# Patient Record
Sex: Male | Born: 1955 | Race: White | Hispanic: No | Marital: Married | Smoking: Never smoker
Health system: Southern US, Community
[De-identification: ages and names within clinical notes are randomized; demographics above are authoritative.]

## PROBLEM LIST (undated history)

## (undated) DIAGNOSIS — M1990 Unspecified osteoarthritis, unspecified site: Secondary | ICD-10-CM

## (undated) DIAGNOSIS — T7840XA Allergy, unspecified, initial encounter: Secondary | ICD-10-CM

## (undated) DIAGNOSIS — I10 Essential (primary) hypertension: Secondary | ICD-10-CM

## (undated) DIAGNOSIS — E785 Hyperlipidemia, unspecified: Secondary | ICD-10-CM

## (undated) DIAGNOSIS — R739 Hyperglycemia, unspecified: Secondary | ICD-10-CM

## (undated) DIAGNOSIS — K219 Gastro-esophageal reflux disease without esophagitis: Secondary | ICD-10-CM

## (undated) HISTORY — PX: STRABISMUS SURGERY: SHX218

## (undated) HISTORY — DX: Gastro-esophageal reflux disease without esophagitis: K21.9

## (undated) HISTORY — DX: Unspecified osteoarthritis, unspecified site: M19.90

## (undated) HISTORY — DX: Hyperlipidemia, unspecified: E78.5

## (undated) HISTORY — DX: Allergy, unspecified, initial encounter: T78.40XA

## (undated) HISTORY — DX: Hyperglycemia, unspecified: R73.9

## (undated) HISTORY — PX: APPENDECTOMY: SHX54

## (undated) HISTORY — DX: Essential (primary) hypertension: I10

---

## 1999-02-23 ENCOUNTER — Encounter: Payer: Self-pay | Admitting: Internal Medicine

## 1999-02-23 ENCOUNTER — Encounter: Admission: RE | Admit: 1999-02-23 | Discharge: 1999-02-23 | Payer: Self-pay | Admitting: Internal Medicine

## 2004-04-16 ENCOUNTER — Ambulatory Visit: Payer: Self-pay | Admitting: Family Medicine

## 2004-04-20 ENCOUNTER — Ambulatory Visit: Payer: Self-pay | Admitting: Family Medicine

## 2004-07-02 ENCOUNTER — Ambulatory Visit: Payer: Self-pay | Admitting: Family Medicine

## 2004-07-04 ENCOUNTER — Emergency Department (HOSPITAL_COMMUNITY): Admission: EM | Admit: 2004-07-04 | Discharge: 2004-07-04 | Payer: Self-pay | Admitting: Family Medicine

## 2004-07-07 ENCOUNTER — Ambulatory Visit: Payer: Self-pay | Admitting: Family Medicine

## 2004-07-27 ENCOUNTER — Ambulatory Visit: Payer: Self-pay | Admitting: Family Medicine

## 2005-05-12 ENCOUNTER — Ambulatory Visit: Payer: Self-pay | Admitting: Family Medicine

## 2005-07-06 ENCOUNTER — Ambulatory Visit: Payer: Self-pay | Admitting: Family Medicine

## 2005-09-18 IMAGING — CT CT HEAD W/O CM
1 of 2 series · 13 of 30 positions shown, 17 images · non-contrast
Comparison: None.

CLINICAL DATA: Spastic jerking movements on and off for three weeks.
TECHNIQUE: Routine non-contrast head CT was performed.

[Series 2: brain · axial · 0.47mm/px · z∈[+109,+242]mm · 13 of 36 slices shown, 17 images]
[im 3/36  brain]
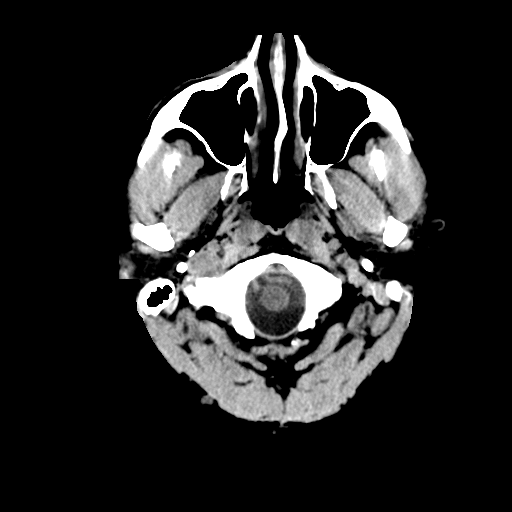
[im 3/36  bone]
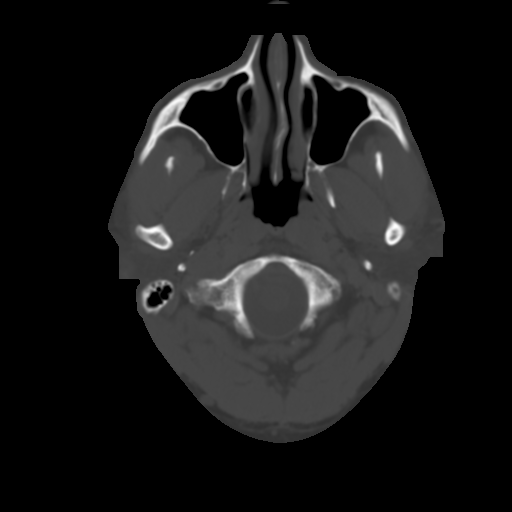
[im 6/36  brain]
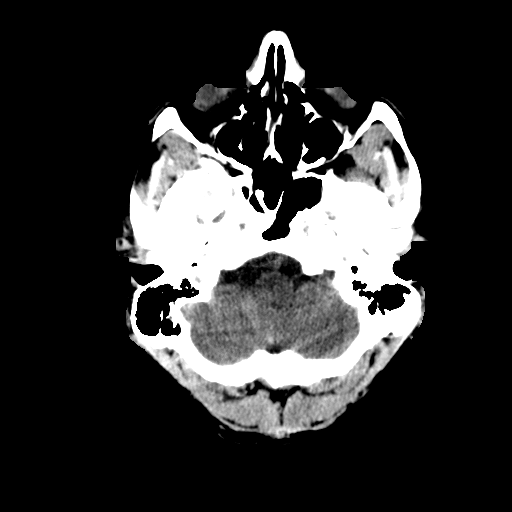
[im 8/36  brain]
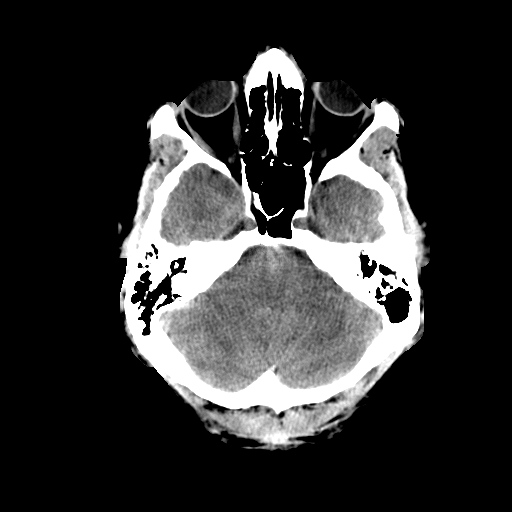
[im 11/36  brain]
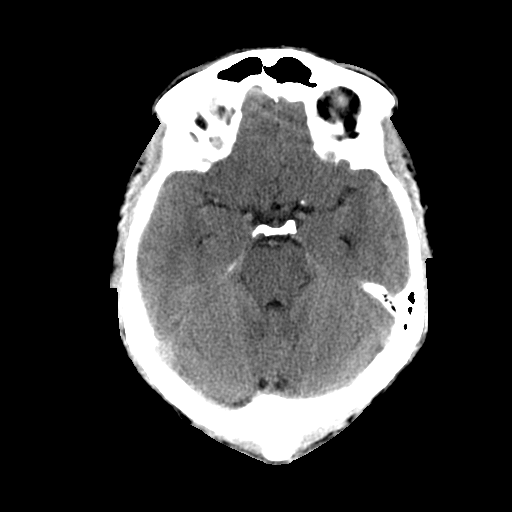
[im 13/36  brain]
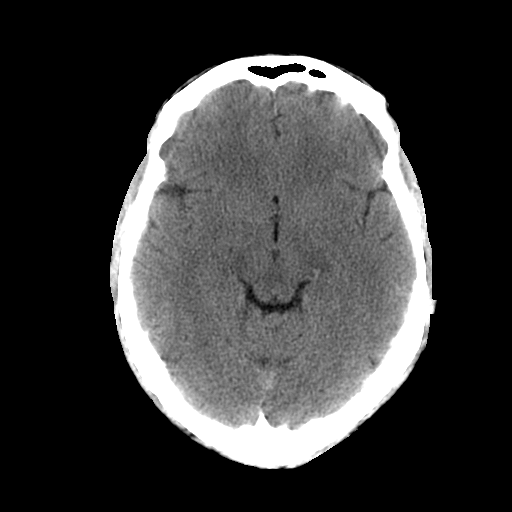
[im 13/36  bone]
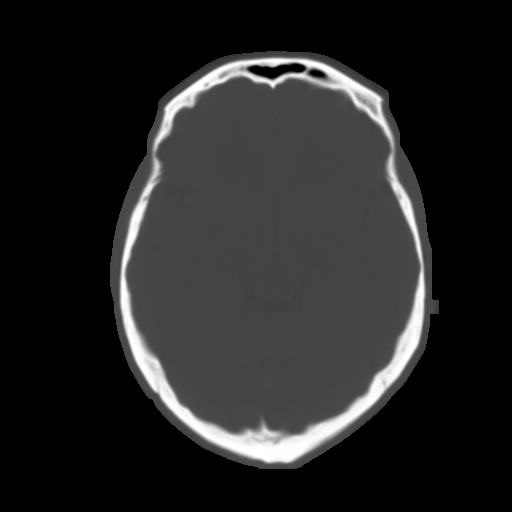
[im 16/36  brain]
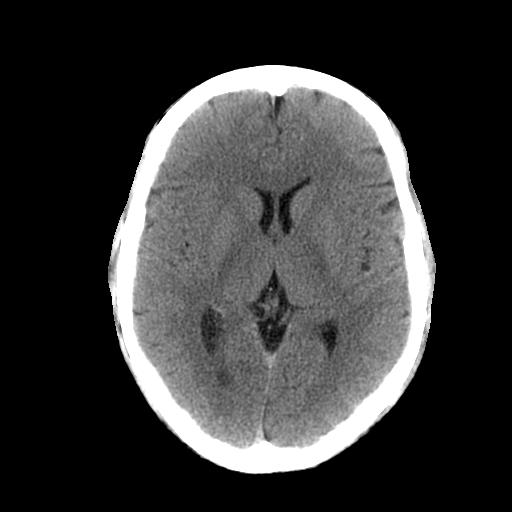
[im 18/36  brain]
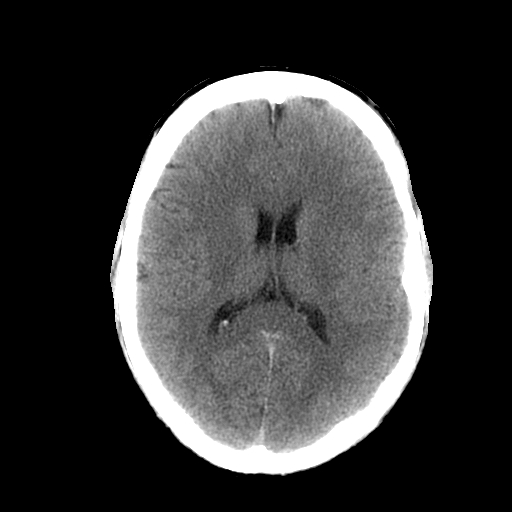
[im 21/36  brain]
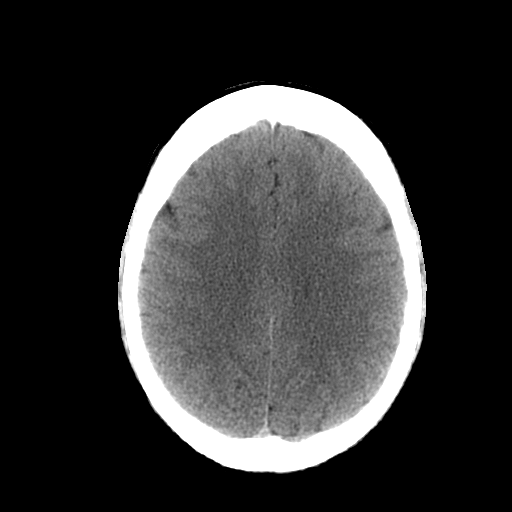
[im 23/36  brain]
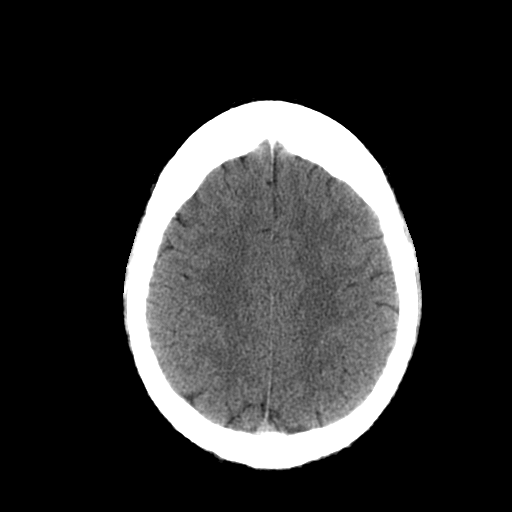
[im 23/36  bone]
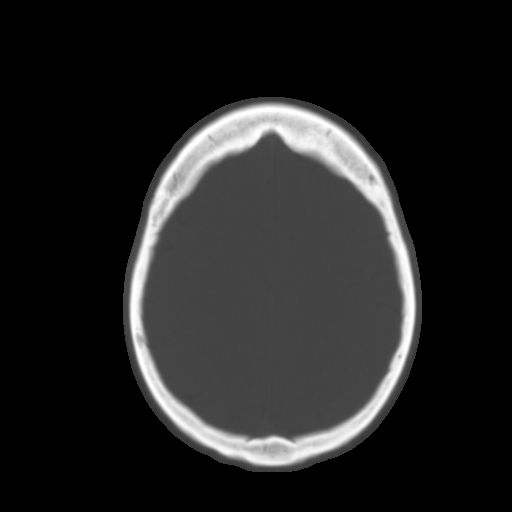
[im 26/36  brain]
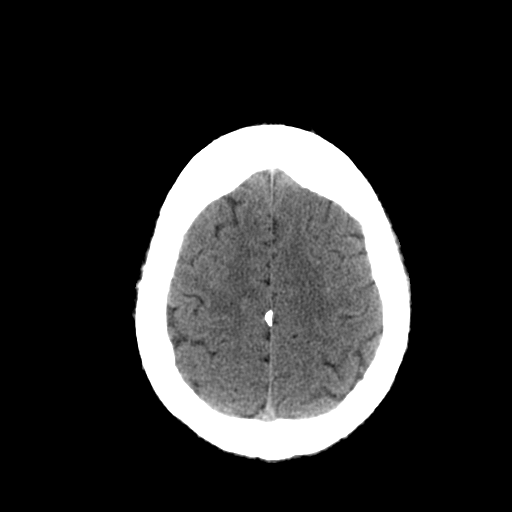
[im 28/36  brain]
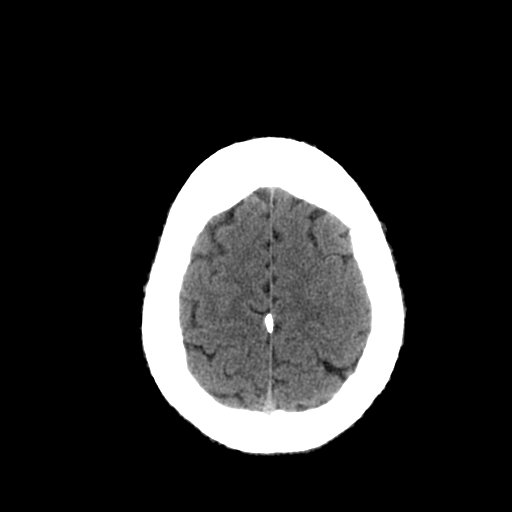
[im 31/36  brain]
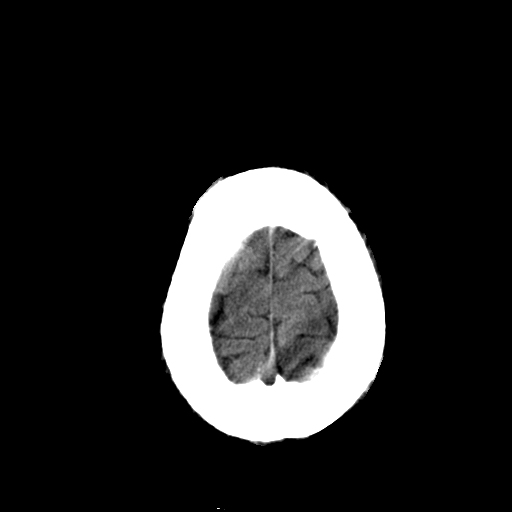
[im 33/36  brain]
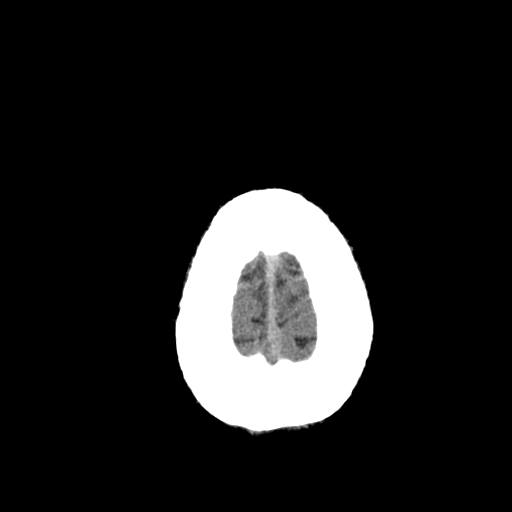
[im 33/36  bone]
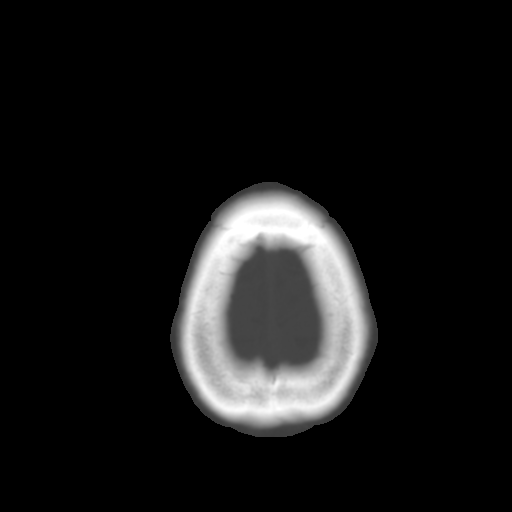

[13 of 30 positions shown; findings below may reference images not displayed]

HEAD CT WITHOUT CONTRAST:

 There is no evidence of intracranial hemorrhage, brain edema, or mass effect. The ventricles are normal. No extra-axial abnormalities are identified. Bone windows show no significant abnormalities.
IMPRESSION: Negative non-contrast head CT.

## 2006-06-30 ENCOUNTER — Ambulatory Visit: Payer: Self-pay | Admitting: Family Medicine

## 2006-09-20 ENCOUNTER — Ambulatory Visit: Payer: Self-pay | Admitting: Family Medicine

## 2006-09-20 LAB — CONVERTED CEMR LAB
ALT: 29 units/L (ref 0–40)
AST: 23 units/L (ref 0–37)
Albumin: 4.4 g/dL (ref 3.5–5.2)
Alkaline Phosphatase: 55 units/L (ref 39–117)
BUN: 14 mg/dL (ref 6–23)
Basophils Absolute: 0 10*3/uL (ref 0.0–0.1)
Basophils Relative: 0.3 % (ref 0.0–1.0)
Bilirubin, Direct: 0.1 mg/dL (ref 0.0–0.3)
CO2: 30 meq/L (ref 19–32)
Calcium: 9.4 mg/dL (ref 8.4–10.5)
Chloride: 108 meq/L (ref 96–112)
Cholesterol: 180 mg/dL (ref 0–200)
Creatinine, Ser: 1.2 mg/dL (ref 0.4–1.5)
Eosinophils Absolute: 0.4 10*3/uL (ref 0.0–0.6)
Eosinophils Relative: 5.1 % — ABNORMAL HIGH (ref 0.0–5.0)
GFR calc Af Amer: 83 mL/min
GFR calc non Af Amer: 69 mL/min
Glucose, Bld: 105 mg/dL — ABNORMAL HIGH (ref 70–99)
HCT: 46.3 % (ref 39.0–52.0)
HDL: 35 mg/dL — ABNORMAL LOW (ref >39.0)
Hemoglobin: 16.2 g/dL (ref 13.0–17.0)
LDL Cholesterol: 118 mg/dL — ABNORMAL HIGH (ref 0–99)
Lymphocytes Relative: 47.6 % — ABNORMAL HIGH (ref 12.0–46.0)
MCHC: 34.9 g/dL (ref 30.0–36.0)
MCV: 90.5 fL (ref 78.0–100.0)
Monocytes Absolute: 0.7 10*3/uL (ref 0.2–0.7)
Monocytes Relative: 9.7 % (ref 3.0–11.0)
Neutro Abs: 2.8 10*3/uL (ref 1.4–7.7)
Neutrophils Relative %: 37.3 % — ABNORMAL LOW (ref 43.0–77.0)
Platelets: 314 10*3/uL (ref 150–400)
Potassium: 4.4 meq/L (ref 3.5–5.1)
RBC: 5.11 M/uL (ref 4.22–5.81)
RDW: 11.8 % (ref 11.5–14.6)
Sodium: 143 meq/L (ref 135–145)
TSH: 2.76 microintl units/mL (ref 0.35–5.50)
Total Bilirubin: 1 mg/dL (ref 0.3–1.2)
Total CHOL/HDL Ratio: 5.1
Total Protein: 7.4 g/dL (ref 6.0–8.3)
Triglycerides: 136 mg/dL (ref 0–149)
VLDL: 27 mg/dL (ref 0–40)
WBC: 7.4 10*3/uL (ref 4.5–10.5)

## 2006-09-25 ENCOUNTER — Ambulatory Visit: Payer: Self-pay | Admitting: Family Medicine

## 2007-02-19 ENCOUNTER — Ambulatory Visit: Payer: Self-pay | Admitting: Family Medicine

## 2007-05-24 ENCOUNTER — Telehealth: Payer: Self-pay | Admitting: Family Medicine

## 2007-05-25 ENCOUNTER — Telehealth (INDEPENDENT_AMBULATORY_CARE_PROVIDER_SITE_OTHER): Payer: Self-pay | Admitting: *Deleted

## 2007-06-11 ENCOUNTER — Ambulatory Visit: Payer: Self-pay | Admitting: Family Medicine

## 2007-06-11 DIAGNOSIS — E785 Hyperlipidemia, unspecified: Secondary | ICD-10-CM | POA: Insufficient documentation

## 2007-06-11 DIAGNOSIS — M109 Gout, unspecified: Secondary | ICD-10-CM | POA: Insufficient documentation

## 2007-06-11 DIAGNOSIS — I10 Essential (primary) hypertension: Secondary | ICD-10-CM | POA: Insufficient documentation

## 2007-07-04 ENCOUNTER — Ambulatory Visit: Payer: Self-pay | Admitting: Family Medicine

## 2007-07-06 LAB — CONVERTED CEMR LAB
ALT: 28 units/L (ref 0–53)
AST: 21 units/L (ref 0–37)
Albumin: 4.2 g/dL (ref 3.5–5.2)
Alkaline Phosphatase: 61 units/L (ref 39–117)
Bilirubin, Direct: 0.2 mg/dL (ref 0.0–0.3)
Cholesterol: 153 mg/dL (ref 0–200)
HDL: 29.5 mg/dL — ABNORMAL LOW (ref >39.0)
LDL Cholesterol: 102 mg/dL — ABNORMAL HIGH (ref 0–99)
Total Bilirubin: 1.1 mg/dL (ref 0.3–1.2)
Total CHOL/HDL Ratio: 5.2
Total Protein: 6.6 g/dL (ref 6.0–8.3)
Triglycerides: 110 mg/dL (ref 0–149)
VLDL: 22 mg/dL (ref 0–40)

## 2008-02-03 ENCOUNTER — Telehealth: Payer: Self-pay | Admitting: Internal Medicine

## 2008-11-10 ENCOUNTER — Ambulatory Visit: Payer: Self-pay | Admitting: Family Medicine

## 2008-11-11 ENCOUNTER — Ambulatory Visit: Payer: Self-pay | Admitting: Family Medicine

## 2008-11-11 DIAGNOSIS — R209 Unspecified disturbances of skin sensation: Secondary | ICD-10-CM | POA: Insufficient documentation

## 2008-11-11 DIAGNOSIS — R7309 Other abnormal glucose: Secondary | ICD-10-CM | POA: Insufficient documentation

## 2008-11-12 LAB — CONVERTED CEMR LAB
ALT: 37 units/L (ref 0–53)
AST: 30 units/L (ref 0–37)
Albumin: 4.2 g/dL (ref 3.5–5.2)
Alkaline Phosphatase: 59 units/L (ref 39–117)
BUN: 11 mg/dL (ref 6–23)
Basophils Absolute: 0 10*3/uL (ref 0.0–0.1)
Basophils Relative: 0.3 % (ref 0.0–3.0)
Bilirubin Urine: NEGATIVE
Bilirubin, Direct: 0.1 mg/dL (ref 0.0–0.3)
CO2: 29 meq/L (ref 19–32)
Calcium: 9.2 mg/dL (ref 8.4–10.5)
Chloride: 107 meq/L (ref 96–112)
Cholesterol: 164 mg/dL (ref 0–200)
Creatinine, Ser: 1 mg/dL (ref 0.4–1.5)
Eosinophils Absolute: 0.4 10*3/uL (ref 0.0–0.7)
Eosinophils Relative: 5.6 % — ABNORMAL HIGH (ref 0.0–5.0)
GFR calc non Af Amer: 83.74 mL/min (ref >60)
Glucose, Bld: 110 mg/dL — ABNORMAL HIGH (ref 70–99)
HCT: 47.7 % (ref 39.0–52.0)
HDL: 34.3 mg/dL — ABNORMAL LOW (ref >39.00)
Hemoglobin: 16 g/dL (ref 13.0–17.0)
Ketones, ur: NEGATIVE mg/dL
LDL Cholesterol: 105 mg/dL — ABNORMAL HIGH (ref 0–99)
Leukocytes, UA: NEGATIVE
Lymphocytes Relative: 43.7 % (ref 12.0–46.0)
Lymphs Abs: 3.1 10*3/uL (ref 0.7–4.0)
MCHC: 33.5 g/dL (ref 30.0–36.0)
MCV: 92.2 fL (ref 78.0–100.0)
Monocytes Absolute: 0.7 10*3/uL (ref 0.1–1.0)
Monocytes Relative: 9.3 % (ref 3.0–12.0)
Neutro Abs: 2.9 10*3/uL (ref 1.4–7.7)
Neutrophils Relative %: 41.1 % — ABNORMAL LOW (ref 43.0–77.0)
Nitrite: NEGATIVE
PSA: 0.39 ng/mL (ref 0.10–4.00)
Platelets: 304 10*3/uL (ref 150.0–400.0)
Potassium: 4.2 meq/L (ref 3.5–5.1)
RBC: 5.18 M/uL (ref 4.22–5.81)
RDW: 12.3 % (ref 11.5–14.6)
Sodium: 142 meq/L (ref 135–145)
Specific Gravity, Urine: 1.02 (ref 1.000–1.030)
TSH: 2.98 microintl units/mL (ref 0.35–5.50)
Total Bilirubin: 1 mg/dL (ref 0.3–1.2)
Total CHOL/HDL Ratio: 5
Total Protein, Urine: NEGATIVE mg/dL
Total Protein: 7 g/dL (ref 6.0–8.3)
Triglycerides: 122 mg/dL (ref 0.0–149.0)
Urine Glucose: NEGATIVE mg/dL
Urobilinogen, UA: 0.2 (ref 0.0–1.0)
VLDL: 24.4 mg/dL (ref 0.0–40.0)
WBC: 7.1 10*3/uL (ref 4.5–10.5)
pH: 6 (ref 5.0–8.0)

## 2008-11-14 LAB — CONVERTED CEMR LAB
Folate: 19.8 ng/mL
Hgb A1c MFr Bld: 5.9 % (ref 4.6–6.5)
Magnesium: 2.6 mg/dL — ABNORMAL HIGH (ref 1.5–2.5)
Vitamin B-12: 260 pg/mL (ref 211–911)

## 2008-11-18 ENCOUNTER — Ambulatory Visit: Payer: Self-pay | Admitting: Family Medicine

## 2009-03-17 ENCOUNTER — Telehealth: Payer: Self-pay | Admitting: Family Medicine

## 2009-03-17 ENCOUNTER — Ambulatory Visit: Payer: Self-pay | Admitting: Family Medicine

## 2009-03-17 DIAGNOSIS — M545 Low back pain, unspecified: Secondary | ICD-10-CM | POA: Insufficient documentation

## 2009-03-30 ENCOUNTER — Telehealth: Payer: Self-pay | Admitting: Family Medicine

## 2009-05-12 ENCOUNTER — Encounter (INDEPENDENT_AMBULATORY_CARE_PROVIDER_SITE_OTHER): Payer: Self-pay | Admitting: *Deleted

## 2009-09-23 ENCOUNTER — Encounter (INDEPENDENT_AMBULATORY_CARE_PROVIDER_SITE_OTHER): Payer: Self-pay | Admitting: *Deleted

## 2009-10-15 ENCOUNTER — Ambulatory Visit: Payer: Self-pay | Admitting: Family Medicine

## 2009-10-15 DIAGNOSIS — N401 Enlarged prostate with lower urinary tract symptoms: Secondary | ICD-10-CM | POA: Insufficient documentation

## 2009-10-27 LAB — CONVERTED CEMR LAB
ALT: 33 units/L (ref 0–53)
AST: 27 units/L (ref 0–37)
Albumin: 4.5 g/dL (ref 3.5–5.2)
Alkaline Phosphatase: 53 units/L (ref 39–117)
BUN: 17 mg/dL (ref 6–23)
Basophils Absolute: 0 10*3/uL (ref 0.0–0.1)
Basophils Relative: 0.5 % (ref 0.0–3.0)
Bilirubin, Direct: 0.2 mg/dL (ref 0.0–0.3)
CO2: 31 meq/L (ref 19–32)
Calcium: 9.4 mg/dL (ref 8.4–10.5)
Chloride: 103 meq/L (ref 96–112)
Cholesterol: 177 mg/dL (ref 0–200)
Creatinine, Ser: 1 mg/dL (ref 0.4–1.5)
Eosinophils Absolute: 0.6 10*3/uL (ref 0.0–0.7)
Eosinophils Relative: 8 % — ABNORMAL HIGH (ref 0.0–5.0)
GFR calc non Af Amer: 86.42 mL/min (ref >60)
Glucose, Bld: 91 mg/dL (ref 70–99)
HCT: 46.5 % (ref 39.0–52.0)
HDL: 39.5 mg/dL (ref >39.00)
Hemoglobin: 16.3 g/dL (ref 13.0–17.0)
Hgb A1c MFr Bld: 5.9 % (ref 4.6–6.5)
LDL Cholesterol: 110 mg/dL — ABNORMAL HIGH (ref 0–99)
Lymphocytes Relative: 41.3 % (ref 12.0–46.0)
Lymphs Abs: 2.9 10*3/uL (ref 0.7–4.0)
MCHC: 35 g/dL (ref 30.0–36.0)
MCV: 93.1 fL (ref 78.0–100.0)
Monocytes Absolute: 0.6 10*3/uL (ref 0.1–1.0)
Monocytes Relative: 8.4 % (ref 3.0–12.0)
Neutro Abs: 2.9 10*3/uL (ref 1.4–7.7)
Neutrophils Relative %: 41.8 % — ABNORMAL LOW (ref 43.0–77.0)
PSA: 0.41 ng/mL (ref 0.10–4.00)
Platelets: 262 10*3/uL (ref 150.0–400.0)
Potassium: 4.4 meq/L (ref 3.5–5.1)
RBC: 4.99 M/uL (ref 4.22–5.81)
RDW: 12.6 % (ref 11.5–14.6)
Sodium: 140 meq/L (ref 135–145)
TSH: 1.19 microintl units/mL (ref 0.35–5.50)
Total Bilirubin: 0.9 mg/dL (ref 0.3–1.2)
Total CHOL/HDL Ratio: 4
Total Protein: 7.6 g/dL (ref 6.0–8.3)
Triglycerides: 136 mg/dL (ref 0.0–149.0)
VLDL: 27.2 mg/dL (ref 0.0–40.0)
WBC: 7 10*3/uL (ref 4.5–10.5)

## 2009-11-04 ENCOUNTER — Encounter (INDEPENDENT_AMBULATORY_CARE_PROVIDER_SITE_OTHER): Payer: Self-pay | Admitting: *Deleted

## 2009-11-06 ENCOUNTER — Ambulatory Visit: Payer: Self-pay | Admitting: Gastroenterology

## 2009-11-16 ENCOUNTER — Telehealth: Payer: Self-pay | Admitting: Gastroenterology

## 2009-11-18 ENCOUNTER — Telehealth: Payer: Self-pay | Admitting: Family Medicine

## 2009-11-20 ENCOUNTER — Ambulatory Visit: Payer: Self-pay | Admitting: Gastroenterology

## 2009-11-20 ENCOUNTER — Telehealth: Payer: Self-pay | Admitting: Family Medicine

## 2009-11-20 HISTORY — PX: COLONOSCOPY: SHX174

## 2009-11-30 ENCOUNTER — Telehealth: Payer: Self-pay | Admitting: Family Medicine

## 2009-12-11 ENCOUNTER — Ambulatory Visit: Payer: Self-pay | Admitting: Family Medicine

## 2009-12-11 DIAGNOSIS — M25519 Pain in unspecified shoulder: Secondary | ICD-10-CM | POA: Insufficient documentation

## 2009-12-11 DIAGNOSIS — I495 Sick sinus syndrome: Secondary | ICD-10-CM | POA: Insufficient documentation

## 2010-01-21 ENCOUNTER — Telehealth: Payer: Self-pay | Admitting: Gastroenterology

## 2010-05-18 NOTE — Letter (Signed)
Summary: LEC Cancel and No Reschedule  [facility]  [address] [place], [place] [address]   Phone: [phone]  Fax: [phone]      May 12, 2009 MRN: [id number]   [facility] [address] [place], [place]  [address]     You recently cancelled your colonoscopy procedure at the [facility] and did not reschedule for another date.    Your provider recommended this procedure for the benefit of your health.  It is very important that you reschedule it.  Failure to do so may be to the detriment of your health.  Please call us at [phone] and we will be happy to assist you with rescheduling.    If you were referred for this procedure by another physician/provider, we will notify him/her that you did not keep your appointment.   Sincerely,  [facility]

## 2010-05-18 NOTE — Progress Notes (Signed)
Summary: prep concern  Phone Note Call from Patient Call back at [phone]   Caller: wife, Milford Lions Call For: Dr. Arlyce Dice Reason for Call: Talk to Nurse Summary of Call: lost instructions for COL Initial call taken by: Vallarie Mare,  November 16, 2009 9:06 AM  Follow-up for Phone Call        Spoke with wife, states he lost prep. instructions wants this fax to her, [phone]. also explained to wife to pick up movi prep from pharmacy [facility]. answered questions and faxed instructions per request. Follow-up by: Sherren Kerns RN,  November 16, 2009 9:39 AM

## 2010-05-18 NOTE — Progress Notes (Signed)
Summary: slow heart  Phone Note Call from Patient Call back at Home Phone [phone]   Caller: Jonathan Howard Summary of Call: Had colonoscopy today with Dr. Arlyce Dice, results fine, no abnormality except HR was 38-41-44 before during procedure.  Advised call to Dr. Clent Ridges.  Is on atenolol, concerned that may be cause. They recommend he leave it off today.  Doing good,  has eaten, walking, alert, not passing out or faint.  Takes atenolol around 11pm.    Initial call taken by: Rudy Jew, RN,  November 20, 2009 11:00 AM  Follow-up for Phone Call        try cutting the Atenolol in half, and take half a pill a day. call us next week Follow-up by: Nelwyn Salisbury MD,  November 20, 2009 11:48 AM  Additional Follow-up for Phone Call Additional follow up Details #1::        Phone Call Completed Additional Follow-up by: Rudy Jew, RN,  November 20, 2009 12:27 PM    New/Updated Medications: ATENOLOL 50 MG TABS (ATENOLOL) Take 1 tablet once a day

## 2010-05-18 NOTE — Letter (Signed)
Summary: [facility] Instructions  [facility]  [address] [place], [place] [address]   Phone: [phone]  Fax: [phone]       Jonathan Howard    [birth year]    MRN: [id number]        Procedure Day /Date:  Friday 11/20/2009     Arrival Time: 7:30 am      Procedure Time: 8:00 am     Location of Procedure:                    _x _  [facility] (4th Floor)                        PREPARATION FOR COLONOSCOPY WITH MOVIPREP   Starting 5 days prior to your procedure Sunday 7/31 do not eat nuts, seeds, popcorn, corn, beans, peas,  salads, or any raw vegetables.  Do not take any fiber supplements (e.g. Metamucil, Citrucel, and Benefiber).  THE DAY BEFORE YOUR PROCEDURE         DATE: Thursday 8/4 1.  Drink clear liquids the entire day-NO SOLID FOOD  2.  Do not drink anything colored red or purple.  Avoid juices with pulp.  No orange juice.  3.  Drink at least 64 oz. (8 glasses) of fluid/clear liquids during the day to prevent dehydration and help the prep work efficiently.  CLEAR LIQUIDS INCLUDE: Water Jello Ice Popsicles Tea (sugar ok, no milk/cream) Powdered fruit flavored drinks Coffee (sugar ok, no milk/cream) Gatorade Juice: apple, white grape, white cranberry  Lemonade Clear bullion, consomm, broth Carbonated beverages (any kind) Strained chicken noodle soup Hard Candy                             4.  In the morning, mix first dose of MoviPrep solution:    Empty 1 Pouch A and 1 Pouch B into the disposable container    Add lukewarm drinking water to the top line of the container. Mix to dissolve    Refrigerate (mixed solution should be used within 24 hrs)  5.  Begin drinking the prep at 5:00 p.m. The MoviPrep container is divided by 4 marks.   Every 15 minutes drink the solution down to the next mark (approximately 8 oz) until the full liter is complete.   6.  Follow completed prep with 16 oz of clear liquid of your choice (Nothing red or  purple).  Continue to drink clear liquids until bedtime.  7.  Before going to bed, mix second dose of MoviPrep solution:    Empty 1 Pouch A and 1 Pouch B into the disposable container    Add lukewarm drinking water to the top line of the container. Mix to dissolve    Refrigerate  THE DAY OF YOUR PROCEDURE      DATE: Friday 8/5  Beginning at 3:00 a.m. (5 hours before procedure):         1. Every 15 minutes, drink the solution down to the next mark (approx 8 oz) until the full liter is complete.  2. Follow completed prep with 16 oz. of clear liquid of your choice.    3. You may drink clear liquids until 6:00 am (2 HOURS BEFORE PROCEDURE).   MEDICATION INSTRUCTIONS  Unless otherwise instructed, you should take regular prescription medications with a small sip of water   as early as possible the morning of your  procedure.   Additional medication instructions: n/a         OTHER INSTRUCTIONS  You will need a responsible adult at least 53 years of age to accompany you and drive you home.   This person must remain in the waiting room during your procedure.  Wear loose fitting clothing that is easily removed.  Leave jewelry and other valuables at home.  However, you may wish to bring a book to read or  an iPod/MP3 player to listen to music as you wait for your procedure to start.  Remove all body piercing jewelry and leave at home.  Total time from sign-in until discharge is approximately 2-3 hours.  You should go home directly after your procedure and rest.  You can resume normal activities the  day after your procedure.  The day of your procedure you should not:   Drive   Make legal decisions   Operate machinery   Drink alcohol   Return to work  You will receive specific instructions about eating, activities and medications before you leave.    The above instructions have been reviewed and explained to me by   Sherren Kerns RN  November 06, 2009 8:18 AM I fully  understand and can verbalize these instructions _____________________________ Date _________

## 2010-05-18 NOTE — Progress Notes (Signed)
Summary: refill  Phone Note Refill Request Call back at Home Phone [phone] Message from:  spouse---live call  Refills Requested: Medication #1:  LIPITOR 20 MG TABS Take 1 tablet by mouth once a day send to [facility]  Initial call taken by: Warnell Forester,  November 18, 2009 3:34 PM    Prescriptions: LIPITOR 20 MG TABS (ATORVASTATIN CALCIUM) Take 1 tablet by mouth once a day  #90 x 3   Entered by:   Raechel Ache, RN   Authorized by:   Nelwyn Salisbury MD   Signed by:   Raechel Ache, RN on 11/18/2009   Method used:   Faxed to ...       [facility] (mail-order)             , [place]         Ph: [phone]       Fax: [phone]   RxID:   [id number]

## 2010-05-18 NOTE — Progress Notes (Signed)
Summary: FYI and needs call back.  Phone Note Call from Patient   Caller: Spouse Call For: Nelwyn Salisbury MD Summary of Call: [phone] Puse running about 53 past few days.  Please call wife. Initial call taken by: Lynann Beaver CMA,  November 30, 2009 10:53 AM  Follow-up for Phone Call        stop the Atenolol completely, then see me 2 weeks later Follow-up by: Nelwyn Salisbury MD,  November 30, 2009 3:33 PM  Additional Follow-up for Phone Call Additional follow up Details #1::        Phone Call Completed Additional Follow-up by: Raechel Ache, RN,  November 30, 2009 3:48 PM

## 2010-05-18 NOTE — Procedures (Signed)
Summary: Colonoscopy  Patient: Jonathan Howard Note: All result statuses are Final unless otherwise noted.  Tests: (1) Colonoscopy (COL)   COL Colonoscopy           DONE     [facility]     [address]     [place], [place]  [address]           OPERATIVE PROCEDURE REPORT           PATIENT:  Jonathan, Howard  MR#:  [id number]   ACCT: [id number]     BIRTHDATE:  06-28-1955, 53 yrs. old  GENDER:  male           ENDOSCOPIST:  Barbette Hair. Arlyce Dice, MD     ASSISTANT:           PROCEDURE DATE:  11/20/2009     PROCEDURE:  Diagnostic Colonoscopy     ASA CLASS:  Class I     INDICATIONS:  1) Routine Risk Screening           MEDICATIONS:  Fentanyl 50 mcg IV, Versed 5 mg IV           DESCRIPTION OF PROCEDURE:   After the risks benefits and     alternatives of the procedure were thoroughly explained, informed     consent was obtained.  Digital rectal exam was performed and     revealed no abnormalities.   The LB [id number] endoscope     was introduced through the anus and advanced to the cecum, which     was identified by the ileocecal valve, without limitations.  The     quality of the prep was excellent, using MoviPrep.  The instrument     was then slowly withdrawn as the colon was fully examined.     <<PROCEDUREIMAGES>>           FINDINGS:  A normal appearing cecum, ileocecal valve, and     appendiceal orifice were identified. Time to cecum: 3.0 minutes.     Withdraw time: 6.75 minutes.  The ascending, hepatic flexure,     transverse, splenic flexure, descending, sigmoid colon, and rectum     appeared unremarkable (see image2, image3, image5, image6, image7,     image9, image10, image11, image14, and image16).   Retroflexed     views in the rectum revealed no abnormalities.    The scope was     then withdrawn from the patient and the procedure terminated.     COMPLICATIONS:  None           ENDOSCOPIC IMPRESSION:     1) Normal colon     RECOMMENDATIONS:     1) Continue current  colorectal screening recommendations for     "routine risk" patients with a repeat colonoscopy in 10 years.           REPEAT EXAM:  In 10 year(s) for Colonoscopy.           ______________________________     Barbette Hair. Arlyce Dice, MD           CC:  Tera Mater. Clent Ridges, M.D.           n.     eSIGNED:   Barbette Hair. Kaplan at 11/20/2009 08:35 AM           Page 2 of 3   Jonathan Howard, Jonathan Howard, [id number]  Note: An exclamation mark (!) indicates a result that was not dispersed into the flowsheet. Document Creation Date: 11/20/2009 8:36 AM  _______________________________________________________________________  (1) Order result status: Final Collection or observation date-time: 11/20/2009 08:29 Requested date-time:  Receipt date-time:  Reported date-time:  Referring Physician:   Ordering Physician: Melvia Heaps [phone]) Specimen Source:  Source: Launa Grill Order Number: [id number] Lab site:   Appended Document: Colonoscopy    Clinical Lists Changes  Observations: Added new observation of COLONNXTDUE: 11/2019 (11/20/2009 13:32)

## 2010-05-18 NOTE — Letter (Signed)
Summary: Previsit letter  [facility]  [address] [place], [place] [address]   Phone: [phone]  Fax: [phone]       09/23/2009 MRN: [id number]  [facility] [address] [place], [place]  [address]  Dear Jonathan Howard,  Welcome to the Gastroenterology Division at [facility].    You are scheduled to see a nurse for your pre-procedure visit on 11-06-09 at 8am on the 3rd floor at [facility], [address]  We ask that you try to arrive at our office 15 minutes prior to your appointment time to allow for check-in.  Your nurse visit will consist of discussing your medical and surgical history, your immediate family medical history, and your medications.    Please bring a complete list of all your medications or, if you prefer, bring the medication bottles and we will list them.  We will need to be aware of both prescribed and over the counter drugs.  We will need to know exact dosage information as well.  If you are on blood thinners (Coumadin, Plavix, Aggrenox, Ticlid, etc.) please call our office today/prior to your appointment, as we need to consult with your physician about holding your medication.   Please be prepared to read and sign documents such as consent forms, a financial agreement, and acknowledgement forms.  If necessary, and with your consent, a friend or relative is welcome to sit-in on the nurse visit with you.  Please bring your insurance card so that we may make a copy of it.  If your insurance requires a referral to see a specialist, please bring your referral form from your primary care physician.  No co-pay is required for this nurse visit.     If you cannot keep your appointment, please call [phone] to cancel or reschedule prior to your appointment date.  This allows [place] the opportunity to schedule an appointment for another patient in need of care.    Thank you for choosing [facility] for your medical needs.  We  appreciate the opportunity to care for you.  Please visit [place] at our website  to learn more about our practice.                     Sincerely.                                                                                                                   The Gastroenterology Division

## 2010-05-18 NOTE — Assessment & Plan Note (Signed)
Summary: RECHECK ON PULSE AND BP/CJR   Vital Signs:  Patient profile:   55 year old male Weight:      221 pounds BMI:     30.93 Pulse rate:   64 / minute Pulse rhythm:   regular BP sitting:   112 / 72  (left arm) Cuff size:   regular  Vitals Entered By: Raechel Ache, RN (December 11, 2009 11:25 AM) CC: F/u pulse- Atenolol on hold. C/o R shoulder pain.   History of Present Illness: Here to follow up on low heart rates and for right shoulder pain. He has had low pulse rates here in our office for several years, in fact one year ago at his cpx the pulse was from 48 to 60 in the office. His BP has been well controlled. He became concerned about this a few weeks ago when the staff doing his colonscopy noticed low pulse rates. We has been off Atenolol completely now for 2 weeks, and he feels fine. In fact he has never been symptomatic at all from his low heart rates. Even off the med, his BP has remained normal as well. The other problem is right shoulder pain, which he thinks results from work. 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 The shoulder has hurt posteriorly and the pain runs down the upper lateral arm. Heat and Diclofenac help. The pain has improved a lot in the past few days.   Allergies: No Known Drug Allergies  Past History:  Past Medical History: Reviewed history from 10/15/2009 and no changes required. Hyperlipidemia Hypertension Gout hyperglycemia  Past Surgical History: Reviewed history from 06/11/2007 and no changes required. Appendectomy  Review of Systems  The patient denies anorexia, fever, weight loss, weight gain, vision loss, decreased hearing, hoarseness, chest pain, syncope, dyspnea on exertion, peripheral edema, prolonged cough, headaches, hemoptysis, abdominal pain, melena, hematochezia, severe indigestion/heartburn, hematuria, incontinence, genital sores, muscle weakness, suspicious skin lesions, transient blindness,  difficulty walking, depression, unusual weight change, abnormal bleeding, enlarged lymph nodes, angioedema, breast masses, and testicular masses.    Physical Exam  General:  Well-developed,well-nourished,in no acute distress; alert,appropriate and cooperative throughout examination Neck:  No deformities, masses, or tenderness noted. Lungs:  Normal respiratory effort, chest expands symmetrically. Lungs are clear to auscultation, no crackles or wheezes. Heart:  slow rate.regular rhythm, no murmur, no gallop, no rub, and no JVD.  EKG shows sinus bradycardia Extremities:  the right shoulder is normal with no crepitus, no tenderness, and full ROM   Impression & Recommendations:  Problem # 1:  SINUS BRADYCARDIA (ICD-427.81)  The following medications were removed from the medication list:    Atenolol 50 Mg Tabs (Atenolol) .Marland Kitchen... Take 1 tablet once a day  Orders: EKG w/ Interpretation (93000)  Problem # 2:  HYPERTENSION (ICD-401.9)  The following medications were removed from the medication list:    Atenolol 50 Mg Tabs (Atenolol) .Marland Kitchen... Take 1 tablet once a day  Problem # 3:  SHOULDER PAIN (ICD-719.41)  Complete Medication List: 1)  Lipitor 20 Mg Tabs (Atorvastatin calcium) .... Take 1 tablet by mouth once a day 2)  Lopid 600 Mg Tabs (Gemfibrozil) .... Take 1 tablet by mouth twice a day  Patient Instructions: 1)  The shoulder strain seems to be improving nicely. This should resolve in the near future. I will have him stay off Atenolol permanently, and we will watch his BP closely.  2)  Please schedule a follow-up appointment in 1 month.

## 2010-05-18 NOTE — Miscellaneous (Signed)
Summary: DIRECT COLON SCREEN/previsit/rm  Clinical Lists Changes  Medications: Added new medication of MOVIPREP 100 GM  SOLR (PEG-KCL-NACL-NASULF-NA ASC-C) As per prep instructions. - Signed Rx of MOVIPREP 100 GM  SOLR (PEG-KCL-NACL-NASULF-NA ASC-C) As per prep instructions.;  #1 x 0;  Signed;  Entered by: Sherren Kerns RN;  Authorized by: Louis Meckel MD;  Method used: Electronically to [facility] [address], [place], [place], [place]  [address], Ph: [phone] or [phone], Fax: [phone] Observations: Added new observation of ALLERGY REV: Done (11/06/2009 8:05)    Prescriptions: MOVIPREP 100 GM  SOLR (PEG-KCL-NACL-NASULF-NA ASC-C) As per prep instructions.  #1 x 0   Entered by:   Sherren Kerns RN   Authorized by:   Louis Meckel MD   Signed by:   Sherren Kerns RN on 11/06/2009   Method used:   Electronically to        [facility] [address] (retail)       [address]       [place], [place]  [address]       Ph: [phone] or [phone]       Fax: [phone]   RxID:   [id number]

## 2010-05-18 NOTE — Progress Notes (Signed)
Summary: Moviprep rebate  Phone Note Call from Patient   Caller: [phone] spouse Jonathan Howard Call For: Jonathan Howard Summary of Call: Do we have a Moviprep rebate they can have? Initial call taken by: Leanor Kail [facility],  January 21, 2010 9:19 AM  Follow-up for Phone Call        Returned pts call, mailed him out a rebate today Follow-up by: Merri Ray CMA Duncan Dull),  January 21, 2010 9:26 AM

## 2010-05-18 NOTE — Assessment & Plan Note (Signed)
Summary: 6 month follow up/cjr   Vital Signs:  Patient profile:   55 year old male Weight:      216 pounds BMI:     30.23 BP sitting:   126 / 80  (left arm) Cuff size:   regular  Vitals Entered By: Raechel Ache, RN (October 15, 2009 11:00 AM) CC: 6 mo ROV.   History of Present Illness: Here for follow up and for labs. He feels fine in general. He is watching his diet closely and has lost 11 lbs over the past 6  months.   Allergies: No Known Drug Allergies  Past History:  Past Medical History: Hyperlipidemia Hypertension Gout hyperglycemia  Review of Systems  The patient denies anorexia, fever, weight gain, vision loss, decreased hearing, hoarseness, chest pain, syncope, dyspnea on exertion, peripheral edema, prolonged cough, headaches, hemoptysis, abdominal pain, melena, hematochezia, severe indigestion/heartburn, hematuria, incontinence, genital sores, muscle weakness, suspicious skin lesions, transient blindness, difficulty walking, depression, unusual weight change, abnormal bleeding, enlarged lymph nodes, angioedema, breast masses, and testicular masses.    Physical Exam  General:  Well-developed,well-nourished,in no acute distress; alert,appropriate and cooperative throughout examination Neck:  No deformities, masses, or tenderness noted. Lungs:  Normal respiratory effort, chest expands symmetrically. Lungs are clear to auscultation, no crackles or wheezes. Heart:  Normal rate and regular rhythm. S1 and S2 normal without gallop, murmur, click, rub or other extra sounds.   Impression & Recommendations:  Problem # 1:  HYPERTENSION (ICD-401.9)  His updated medication list for this problem includes:    Atenolol 50 Mg Tabs (Atenolol) .Marland Kitchen... Take 1 tablet once a day  Orders: UA Dipstick w/o Micro (automated)  (81003) Venipuncture (16109) TLB-Lipid Panel (80061-LIPID) TLB-BMP (Basic Metabolic Panel-BMET) (80048-METABOL) TLB-CBC Platelet - w/Differential  (85025-CBCD) TLB-Hepatic/Liver Function Pnl (80076-HEPATIC) TLB-TSH (Thyroid Stimulating Hormone) (84443-TSH)  Problem # 2:  HYPERLIPIDEMIA (ICD-272.4)  His updated medication list for this problem includes:    Lipitor 20 Mg Tabs (Atorvastatin calcium) .Marland Kitchen... Take 1 tablet by mouth once a day    Lopid 600 Mg Tabs (Gemfibrozil) .Marland Kitchen... Take 1 tablet by mouth twice a day  Problem # 3:  HYPERGLYCEMIA (ICD-790.29)  Orders: TLB-A1C / Hgb A1C (Glycohemoglobin) (83036-A1C)  Complete Medication List: 1)  Atenolol 50 Mg Tabs (Atenolol) .... Take 1 tablet once a day 2)  Lipitor 20 Mg Tabs (Atorvastatin calcium) .... Take 1 tablet by mouth once a day 3)  Lopid 600 Mg Tabs (Gemfibrozil) .... Take 1 tablet by mouth twice a day  Other Orders: TLB-PSA (Prostate Specific Antigen) (84153-PSA)  Patient Instructions: 1)  Get labs today Prescriptions: LOPID 600 MG TABS (GEMFIBROZIL) Take 1 tablet by mouth twice a day  #180 x 3   Entered and Authorized by:   Nelwyn Salisbury MD   Signed by:   Nelwyn Salisbury MD on 10/15/2009   Method used:   Print then Give to Patient   RxID:   [id number] LIPITOR 20 MG TABS (ATORVASTATIN CALCIUM) Take 1 tablet by mouth once a day  #90 x 3   Entered and Authorized by:   Nelwyn Salisbury MD   Signed by:   Nelwyn Salisbury MD on 10/15/2009   Method used:   Print then Give to Patient   RxID:   [id number] ATENOLOL 50 MG TABS (ATENOLOL) Take 1 tablet once a day  #30 x 11   Entered and Authorized by:   Nelwyn Salisbury MD   Signed by:   Nelwyn Salisbury MD  on 10/15/2009   Method used:   Electronically to        [facility] [address] (retail)       [address]       [place], [place]  [address]       Ph: [phone] or [phone]       Fax: [phone]   RxID:   [id number]   Appended Document: Orders Update    Clinical Lists Changes  Observations: Added new observation of COMMENTS: Wynona Canes, CMA  October 15, 2009 2:59 PM  (10/15/2009 14:58) Added new observation of PH URINE: 5.5  (10/15/2009 14:58) Added new observation of SPEC GR URIN: 1.025  (10/15/2009 14:58) Added new observation of APPEARANCE U: Clear  (10/15/2009 14:58) Added new observation of UA COLOR: yellow  (10/15/2009 14:58) Added new observation of WBC DIPSTK U: negative  (10/15/2009 14:58) Added new observation of NITRITE URN: negative  (10/15/2009 14:58) Added new observation of UROBILINOGEN: 0.2  (10/15/2009 14:58) Added new observation of PROTEIN, URN: trace  (10/15/2009 14:58) Added new observation of BLOOD UR DIP: negative  (10/15/2009 14:58) Added new observation of KETONES URN: negative  (10/15/2009 14:58) Added new observation of BILIRUBIN UR: negative  (10/15/2009 14:58) Added new observation of GLUCOSE, URN: negative  (10/15/2009 14:58)      Laboratory Results   Urine Tests  Date/Time Recieved: October 15, 2009 2:59 PM  Date/Time Reported: October 15, 2009 2:58 PM   Routine Urinalysis   Color: yellow Appearance: Clear Glucose: negative   (Normal Range: Negative) Bilirubin: negative   (Normal Range: Negative) Ketone: negative   (Normal Range: Negative) Spec. Gravity: 1.025   (Normal Range: 1.003-1.035) Blood: negative   (Normal Range: Negative) pH: 5.5   (Normal Range: 5.0-8.0) Protein: trace   (Normal Range: Negative) Urobilinogen: 0.2   (Normal Range: 0-1) Nitrite: negative   (Normal Range: Negative) Leukocyte Esterace: negative   (Normal Range: Negative)    Comments: Wynona Canes, CMA  October 15, 2009 2:59 PM

## 2010-08-31 NOTE — Assessment & Plan Note (Signed)
[facility] OFFICE NOTE   COLBEY, WIRTANEN                        MRN:          [id number]  DATE:09/25/2006                            DOB:          Oct 14, 1955    This is a 55 year old gentleman here for a complete physical  examination.  We have been following him primarily for elevated lipids  and hypertension.  He continues to do well with no real complaints.  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  He did receive his first hepatitis B shot, but has not been  able to get any since.  He continues to watch his diet closely, but does  not get much in the way of cardiovascular exercise because he works long  hours.  Further details of his past medical history, family history,  social history, habits, etc., I refer you to our last physical note  dated April 20, 2004.   ALLERGIES:  None.   CURRENT MEDICATIONS:  1. Atenolol 50 mg per day.  2. Lipitor 20 mg per day.  3. Lopid 600 mg per day.  4. Vitamin E daily.   OBJECTIVE:  Height 5 feet 11 inches, weight 220, BP 118/62, pulse 52 and  regular.  IN GENERAL:  He appears to be doing well although he is a little  overweight.  SKIN:  Clear.  EYES:  Clear.  He wears glasses.  EARS:  Clear.  OROPHARYNX:  Clear.  NECK:  Supple without lymphadenopathy or masses.  LUNGS:  Clear.  CARDIAC:  Rate and rhythm regular without gallops, murmurs, or rubs.  Distal pulses are full.  ABDOMEN:  Soft, normal bowel sounds, nontender, no masses.  GENITALIA:  Normal male.  He is circumcised.  EXTREMITIES:  No cyanosis, clubbing, or edema.  NEUROLOGIC EXAM:  Grossly intact.   He was here for fasting labs on June 4.  These were remarkable only for  his lipid panel.  Triglycerides were well controlled at 136.  HDL is  slightly low at 35, and LDL is slightly high  at 118 which represents no  change from a year ago.   ASSESSMENT AND PLAN:  1. Complete physical.  Talked to him about getting regular exercise      and dropping a couple of pounds.  He was also given his second      hepatitis B immunization today.  His third shot in the series will      be due 5 months from now.  2. Hypertension, stable.  3. Hyperlipidemia, stable.  4. Hypertriglyceridemia, stable.     Tera Mater. Clent Ridges, MD  Electronically Signed    SAF/MedQ  DD: 09/25/2006  DT: 09/25/2006  Job #: [id number]

## 2010-09-03 NOTE — Assessment & Plan Note (Signed)
[facility]                                 ON-CALL NOTE   MANNIE, OHLIN                        MRN:          [id number]  DATE:06/28/2006                            DOB:          [birth year]    TIME RECEIVED:  8:39 p.m.   CALLER:  [place].   TELEPHONE:  [phone]   The patient developed a tender knot with some swelling on the back of  his head behind one ear earlier this morning.  It has gotten a little  more tender and a little larger throughout the day.  There is no fever.  Our response is to apply hot compresses to the area overnight and to see  me in the office tomorrow morning.     Tera Mater. Clent Ridges, MD  Electronically Signed    SAF/MedQ  DD: 06/28/2006  DT: 06/30/2006  Job #: [id number]

## 2010-12-03 ENCOUNTER — Other Ambulatory Visit (INDEPENDENT_AMBULATORY_CARE_PROVIDER_SITE_OTHER): Payer: 59

## 2010-12-03 DIAGNOSIS — Z0000 Encounter for general adult medical examination without abnormal findings: Secondary | ICD-10-CM

## 2010-12-03 LAB — CBC WITH DIFFERENTIAL/PLATELET
Basophils Relative: 0.4 % (ref 0.0–3.0)
Eosinophils Relative: 4 % (ref 0.0–5.0)
Hemoglobin: 16.4 g/dL (ref 13.0–17.0)
Lymphocytes Relative: 39.7 % (ref 12.0–46.0)
Neutro Abs: 3.2 10*3/uL (ref 1.4–7.7)
Neutrophils Relative %: 46.7 % (ref 43.0–77.0)
RBC: 5.18 Mil/uL (ref 4.22–5.81)
WBC: 6.8 10*3/uL (ref 4.5–10.5)

## 2010-12-03 LAB — HEPATIC FUNCTION PANEL
ALT: 56 U/L — ABNORMAL HIGH (ref 0–53)
AST: 36 U/L (ref 0–37)
Albumin: 4.3 g/dL (ref 3.5–5.2)
Alkaline Phosphatase: 70 U/L (ref 39–117)
Bilirubin, Direct: 0.2 mg/dL (ref 0.0–0.3)
Total Protein: 6.9 g/dL (ref 6.0–8.3)

## 2010-12-03 LAB — TSH: TSH: 1.35 u[IU]/mL (ref 0.35–5.50)

## 2010-12-03 LAB — BASIC METABOLIC PANEL
CO2: 29 mEq/L (ref 19–32)
Calcium: 9.3 mg/dL (ref 8.4–10.5)
Chloride: 108 mEq/L (ref 96–112)
Creatinine, Ser: 1.1 mg/dL (ref 0.4–1.5)
Sodium: 144 mEq/L (ref 135–145)

## 2010-12-03 LAB — POCT URINALYSIS DIPSTICK
Bilirubin, UA: NEGATIVE
Glucose, UA: NEGATIVE
Leukocytes, UA: NEGATIVE
Nitrite, UA: NEGATIVE
pH, UA: 5.5

## 2010-12-03 LAB — PSA: PSA: 0.39 ng/mL (ref 0.10–4.00)

## 2010-12-03 LAB — LIPID PANEL: Total CHOL/HDL Ratio: 4

## 2010-12-07 ENCOUNTER — Telehealth: Payer: Self-pay

## 2010-12-07 NOTE — Telephone Encounter (Signed)
Labs mailed

## 2010-12-07 NOTE — Telephone Encounter (Signed)
Message copied by Beverely Low on Tue Dec 07, 2010  1:17 PM ------      Message from: Gershon Crane A      Created: Tue Dec 07, 2010  5:36 AM       normal

## 2010-12-10 ENCOUNTER — Encounter: Payer: Self-pay | Admitting: Family Medicine

## 2010-12-10 ENCOUNTER — Ambulatory Visit (INDEPENDENT_AMBULATORY_CARE_PROVIDER_SITE_OTHER): Payer: 59 | Admitting: Family Medicine

## 2010-12-10 VITALS — BP 126/84 | HR 62 | Temp 98.7°F | Ht 72.0 in | Wt 218.0 lb

## 2010-12-10 DIAGNOSIS — Z0000 Encounter for general adult medical examination without abnormal findings: Secondary | ICD-10-CM

## 2010-12-10 MED ORDER — ATORVASTATIN CALCIUM 20 MG PO TABS: 20.0000 mg | ORAL_TABLET | Freq: Every day | ORAL | Status: DC

## 2010-12-10 NOTE — Progress Notes (Signed)
  Subjective:    Patient ID: Jonathan Howard, male    DOB: 14-Dec-1955, 55 y.o.   MRN: [id number]  HPI 55 yr old male for a cpx. He feels fine and has no complaints. He has ben watching his diet closely and eating oatmeal every morning. He admits to not taking any Lopid for several months.    Review of Systems  Constitutional: Negative.   HENT: Negative.   Eyes: Negative.   Respiratory: Negative.   Cardiovascular: Negative.   Gastrointestinal: Negative.   Genitourinary: Negative.   Musculoskeletal: Negative.   Skin: Negative.   Neurological: Negative.   Hematological: Negative.   Psychiatric/Behavioral: Negative.        Objective:   Physical Exam  Constitutional: He is oriented to person, place, and time. He appears well-developed and well-nourished. No distress.  HENT:  Head: Normocephalic and atraumatic.  Right Ear: External ear normal.  Left Ear: External ear normal.  Nose: Nose normal.  Mouth/Throat: Oropharynx is clear and moist. No oropharyngeal exudate.  Eyes: Conjunctivae and EOM are normal. Pupils are equal, round, and reactive to light. Right eye exhibits no discharge. Left eye exhibits no discharge. No scleral icterus.  Neck: Neck supple. No JVD present. No tracheal deviation present. No thyromegaly present.  Cardiovascular: Normal rate, regular rhythm, normal heart sounds and intact distal pulses.  Exam reveals no gallop and no friction rub.   No murmur heard. Pulmonary/Chest: Effort normal and breath sounds normal. No respiratory distress. He has no wheezes. He has no rales. He exhibits no tenderness.  Abdominal: Soft. Bowel sounds are normal. He exhibits no distension and no mass. There is no tenderness. There is no rebound and no guarding.  Genitourinary: Rectum normal, prostate normal and penis normal. Guaiac negative stool. No penile tenderness.  Musculoskeletal: Normal range of motion. He exhibits no edema and no tenderness.  Lymphadenopathy:    He has no  cervical adenopathy.  Neurological: He is alert and oriented to person, place, and time. He has normal reflexes. No cranial nerve deficit. He exhibits normal muscle tone. Coordination normal.  Skin: Skin is warm and dry. No rash noted. He is not diaphoretic. No erythema. No pallor.  Psychiatric: He has a normal mood and affect. His behavior is normal. Judgment and thought content normal.          Assessment & Plan:  Well exam. We will stop the Lopid and stay on Lipitor.

## 2011-01-10 ENCOUNTER — Encounter: Payer: Self-pay | Admitting: Family Medicine

## 2011-01-10 ENCOUNTER — Ambulatory Visit (INDEPENDENT_AMBULATORY_CARE_PROVIDER_SITE_OTHER): Payer: 59 | Admitting: Family Medicine

## 2011-01-10 ENCOUNTER — Telehealth: Payer: Self-pay | Admitting: *Deleted

## 2011-01-10 VITALS — BP 120/70 | HR 50 | Temp 98.5°F | Wt 219.0 lb

## 2011-01-10 DIAGNOSIS — M109 Gout, unspecified: Secondary | ICD-10-CM

## 2011-01-10 MED ORDER — METHYLPREDNISOLONE ACETATE 80 MG/ML IJ SUSP
120.0000 mg | Freq: Once | INTRAMUSCULAR | Status: AC
  Administered 2011-01-10: 120 mg via INTRAMUSCULAR

## 2011-01-10 NOTE — Telephone Encounter (Signed)
[facility] Triage Call Report Triage Record Num: [id number] Operator: [name] Patient Name: Jonathan Howard Call Date & Time: 01/08/2011 6:30:16PM Patient Phone: [phone] PCP: Tera Mater. Clent Ridges Patient Gender: Male PCP Fax : [phone] Patient DOB: Apr 20, 1955 [facility] Name[facility] Reason for Call: Pt. calling. Afebrile/subjective. Gout flare up in left great toe. Onset sx 9/21. Pain rated at 7 of 10 when putting pressure on it. Caller relates toe joint is twice normal size. 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 medication for same. He took Prednisone one tablet from previous illness that did not make improvement in how he feels. Karin Golden on [employer]. Indomethcin 50 TID ordered for patient previously. Per standing orders authorized enough Indomethacin to 6 doses until office reopens on 9/27. Advised patient to follow up with office on 9/27. Protocol(s) Used: Foot Non-Injury Recommended Outcome per Protocol: See Provider within 4 hours Override Outcome if Used in Protocol: Call Provider When Office is Open RN Reason for Override Outcome: Other Reason for Outcome: New marked swelling (twice the normal size as compared to usual appearance) Care Advice: ~ List, or take, all current prescription(s), nonprescription or alternative medication(s) to provider for evaluation. 01/08/2011 6:46:49PM Page 1 of 1 CAN_TriageRpt_V2

## 2011-01-10 NOTE — Telephone Encounter (Signed)
[facility] Triage Call Report Triage Record Num: [id number] Operator: Lesli Albee Patient Name: Jonathan Howard Call Date & Time: 01/09/2011 7:56:40PM Patient Phone: [phone] PCP: Tera Mater. Clent Ridges Patient Gender: Male PCP Fax : [phone] Patient DOB: 1955/09/07 Practice Name: [facility] Reason for Call: [name], calling regarding Other. PCP is Bernie Covey number is [phone]. Pt called in and the office prescribed Indomethacin for gout on 01/08/11. Pt has had 4 episodes of gout in the past 20 years. Pt states the pain is intolerable today. RN advised ED or appt in the am. Protocol(s) Used: Foot Non-Injury Recommended Outcome per Protocol: See ED Immediately Reason for Outcome: Unbearable pain Care Advice: ~ Protect the patient from falling or other harm. ~ Avoid moving affected part. Protect from any injury. ~ IMMEDIATE ACTION ~ DO NOT attempt to place any weight on the affected extremity until evaluated by provider. 01/09/2011 8:08:29PM Page 1 of 1 CAN_TriageRpt_V2

## 2011-01-10 NOTE — Progress Notes (Signed)
Addended by: Aniceto Boss A on: 01/10/2011 12:55 PM   Modules accepted: Orders

## 2011-01-10 NOTE — Progress Notes (Signed)
  Subjective:    Patient ID: Jonathan Howard, male    DOB: 09-10-1955, 55 y.o.   MRN: [id number]  HPI Here for 4 days of a swollen painful left great toe. He had a gout episode about 3 months ago. Before that it had been several years. He has taken several days of Indocin with no relief.   Review of Systems  Constitutional: Negative.   Musculoskeletal: Positive for joint swelling and arthralgias.       Objective:   Physical Exam  Constitutional: He appears well-developed and well-nourished.       Limping   Musculoskeletal:       The left great toe is red, hot, swollen, and tender at the MTP           Assessment & Plan:  Off work today

## 2011-06-02 ENCOUNTER — Ambulatory Visit (INDEPENDENT_AMBULATORY_CARE_PROVIDER_SITE_OTHER): Payer: 59 | Admitting: Family Medicine

## 2011-06-02 ENCOUNTER — Encounter: Payer: Self-pay | Admitting: Family Medicine

## 2011-06-02 ENCOUNTER — Telehealth: Payer: Self-pay | Admitting: Family Medicine

## 2011-06-02 VITALS — BP 130/98 | HR 88 | Temp 98.7°F | Wt 219.0 lb

## 2011-06-02 DIAGNOSIS — M533 Sacrococcygeal disorders, not elsewhere classified: Secondary | ICD-10-CM

## 2011-06-02 MED ORDER — METHYLPREDNISOLONE ACETATE 80 MG/ML IJ SUSP
120.0000 mg | Freq: Once | INTRAMUSCULAR | Status: AC
  Administered 2011-06-02: 120 mg via INTRAMUSCULAR

## 2011-06-02 NOTE — Progress Notes (Signed)
  Subjective:    Patient ID: Jonathan Howard, male    DOB: 07/26/1955, 56 y.o.   MRN: [id number]  HPI Here for 3 days of sharp pains in the right lower back. No recent trauma, but this started after her worked a long 19 hour day on his feet. No radiation of pain to the legs. Using heat, Flexeril, and Motrin.   Review of Systems  Constitutional: Negative.   Musculoskeletal: Positive for back pain.       Objective:   Physical Exam  Constitutional:       In pain, walks slowly   Musculoskeletal:       Tender over the right sacroiliac joint, full ROM           Assessment & Plan:  Given a steroid shot. Off work today until 06-06-11.

## 2011-06-02 NOTE — Telephone Encounter (Signed)
Called pt to make aware work note faxed to [phone].

## 2011-06-02 NOTE — Telephone Encounter (Signed)
Pt called and said that a work excuse letter was suppose to be faxed to pt work. The fax # is Fax# [phone]. Pls fax this asap. Pls call pt and let him know when this has been done.

## 2011-06-06 ENCOUNTER — Telehealth: Payer: Self-pay | Admitting: Family Medicine

## 2011-06-06 NOTE — Telephone Encounter (Signed)
Needs Dr.'s note re faxed to [phone] attn: Rick [place] state they haven't received it

## 2011-06-06 NOTE — Telephone Encounter (Signed)
I faxed again today.

## 2011-09-26 ENCOUNTER — Ambulatory Visit (INDEPENDENT_AMBULATORY_CARE_PROVIDER_SITE_OTHER): Payer: 59 | Admitting: Family

## 2011-09-26 ENCOUNTER — Encounter: Payer: Self-pay | Admitting: Family

## 2011-09-26 VITALS — BP 148/100 | HR 102 | Temp 97.8°F | Wt 210.0 lb

## 2011-09-26 DIAGNOSIS — R109 Unspecified abdominal pain: Secondary | ICD-10-CM

## 2011-09-26 DIAGNOSIS — R10A0 Flank pain, unspecified side: Secondary | ICD-10-CM

## 2011-09-26 DIAGNOSIS — M545 Low back pain, unspecified: Secondary | ICD-10-CM

## 2011-09-26 LAB — POCT URINALYSIS DIPSTICK
Glucose, UA: NEGATIVE
Nitrite, UA: NEGATIVE
Urobilinogen, UA: 0.2

## 2011-09-26 MED ORDER — OXYCODONE-ACETAMINOPHEN 5-325 MG PO TABS: 1.0000 | ORAL_TABLET | Freq: Three times a day (TID) | ORAL | Status: AC | PRN

## 2011-09-26 MED ORDER — CYCLOBENZAPRINE HCL 10 MG PO TABS: 10.0000 mg | ORAL_TABLET | Freq: Three times a day (TID) | ORAL | Status: AC | PRN

## 2011-09-26 MED ORDER — KETOROLAC TROMETHAMINE 60 MG/2ML IM SOLN
60.0000 mg | Freq: Once | INTRAMUSCULAR | Status: AC
  Administered 2011-09-26: 60 mg via INTRAMUSCULAR

## 2011-09-26 NOTE — Progress Notes (Signed)
Subjective:    Patient ID: Jonathan Howard, male    DOB: 11/28/1955, 56 y.o.   MRN: [id number]  HPI 56 year old white male, nonsmoker, patient of Dr. Clent Ridges is in today with complaints of left lower severe back pain times one day. Patient denies any heavy lifting. 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 Pain is a 10/10 and worse with movement. He has been taking Tylenol and Advil with no relief. He has a past medical history of low back pain and lumbar strain. Last episode was about 3 years ago. He denies any burning with urination, blood in his urine, abdominal pain   Review of Systems  Constitutional: Negative.   Respiratory: Negative.   Cardiovascular: Negative.   Genitourinary: Negative.   Musculoskeletal: Positive for back pain.  Skin: Negative.   Neurological: Negative.   Hematological: Negative.   Psychiatric/Behavioral: Negative.    Past Medical History  Diagnosis Date  . Hyperlipidemia   . Hypertension   . Gout   . Hyperglycemia     History   Social History  . Marital Status: Married    Spouse Name: N/A    Number of Children: N/A  . Years of Education: N/A   Occupational History  . Not on file.   Social History Main Topics  . Smoking status: Never Smoker   . Smokeless tobacco: Never Used  . Alcohol Use: No  . Drug Use: No  . Sexually Active: Not on file   Other Topics Concern  . Not on file   Social History Narrative  . No narrative on file    Past Surgical History  Procedure Date  . Appendectomy   . Colonoscopy 11-20-09    per Dr. Arlyce Dice, clear, repeat 10 yrs    Family History  Problem Relation Age of Onset  . Alcohol abuse    . Arthritis    . Coronary artery disease    . Diabetes    . Hypertension      No Known Allergies  Current Outpatient Prescriptions on File Prior to Visit  Medication Sig Dispense Refill  . atorvastatin (LIPITOR) 20 MG tablet Take 1 tablet (20 mg total) by mouth daily.  30 tablet  11    No current facility-administered medications on file prior to visit.    BP 148/100  Pulse 102  Temp(Src) 97.8 F (36.6 C) (Oral)  Wt 210 lb (95.255 kg)chart    Objective:   Physical Exam  Constitutional: He is oriented to person, place, and time. He appears well-developed and well-nourished.  Neck: Normal range of motion. Neck supple.  Cardiovascular: Normal rate, regular rhythm and normal heart sounds.   Pulmonary/Chest: Effort normal and breath sounds normal.  Musculoskeletal:       The patient has about a 45 flexion at the hip before pain is elicited. Unable to rotate laterally. Unable to perform straight leg raise maneuver because patient's unable to lie down. Tenderness to palpation of the left lower back. Pain is reproducible.  Neurological: He is alert and oriented to person, place, and time. He has normal reflexes.  Skin: Skin is warm and dry.  Psychiatric: He has a normal mood and affect.    Toradol 60 mg IM x1 given      Assessment & Plan:  Assessment: Lumbar strain, low back pain  Plan: Advised rest, heat to the affected area, Flexeril 3 times a day, Percocet as needed for pain. Out of work x3 days. Patient to call  the office if symptoms worsen or persist. Recheck a schedule, appearing.

## 2011-09-26 NOTE — Patient Instructions (Signed)

## 2011-09-28 ENCOUNTER — Ambulatory Visit
Admission: RE | Admit: 2011-09-28 | Discharge: 2011-09-28 | Disposition: A | Discharge status: Home / Self Care | Payer: 59 | Source: Ambulatory Visit | Attending: Family Medicine | Admitting: Family Medicine

## 2011-09-28 ENCOUNTER — Ambulatory Visit (INDEPENDENT_AMBULATORY_CARE_PROVIDER_SITE_OTHER): Payer: 59 | Admitting: Family Medicine

## 2011-09-28 ENCOUNTER — Encounter: Payer: Self-pay | Admitting: Family Medicine

## 2011-09-28 VITALS — BP 128/78 | HR 90 | Temp 98.5°F

## 2011-09-28 DIAGNOSIS — M545 Low back pain, unspecified: Secondary | ICD-10-CM

## 2011-09-28 DIAGNOSIS — R079 Chest pain, unspecified: Secondary | ICD-10-CM

## 2011-09-28 MED ORDER — PROMETHAZINE HCL 25 MG PO TABS: 25.0000 mg | ORAL_TABLET | ORAL | Status: DC | PRN

## 2011-09-28 NOTE — Progress Notes (Signed)
  Subjective:    Patient ID: Jonathan Howard, male    DOB: 09-16-1955, 56 y.o.   MRN: [id number]  HPI Here for 3 days of severe pain in the lower back. No recent trauma. The pain is on both sides of the lower back, left is worse than right. No pain into the legs. He was seen 2 days ago and given some Percocet and Flexeril. This helps a little but not much. They are using ice packs at home. Today he also complains of epigastric pains and nausea. No chest pain or SOB.    Review of Systems  Constitutional: Negative.   Respiratory: Negative.   Cardiovascular: Negative.   Gastrointestinal: Positive for nausea and abdominal pain. Negative for vomiting, diarrhea, constipation, blood in stool and abdominal distention.  Musculoskeletal: Positive for back pain.       Objective:   Physical Exam  Constitutional:       In severe pain, is barely able to walk, retching into a trash can  Neck: No thyromegaly present.  Cardiovascular: Normal rate, regular rhythm, normal heart sounds and intact distal pulses.  Exam reveals no gallop and no friction rub.   No murmur heard.      EKG normal   Pulmonary/Chest: Effort normal and breath sounds normal.  Abdominal: Soft. Bowel sounds are normal. He exhibits no distension. There is no tenderness. There is no rebound and no guarding.  Musculoskeletal:       Tender in the lower back with reduced ROM and positive SLR bilaterally   Lymphadenopathy:    He has no cervical adenopathy.          Assessment & Plan:  Probable herniated disc in the lumbar spine. He will increase the Percocets to take 2 tablets at a time tid. The pain meds are causing some gastric upset and nausea, so we will add Zantac bid and Phenergan prn. Out of work today until 10-03-11. Set up a lumbar MRI soon

## 2011-09-30 ENCOUNTER — Telehealth: Payer: Self-pay | Admitting: Family Medicine

## 2011-09-30 DIAGNOSIS — M545 Low back pain, unspecified: Secondary | ICD-10-CM

## 2011-09-30 MED ORDER — METHYLPREDNISOLONE 4 MG PO KIT: PACK | ORAL | Status: DC

## 2011-09-30 NOTE — Addendum Note (Signed)
Addended by: Aniceto Boss A on: 09/30/2011 04:18 PM   Modules accepted: Orders

## 2011-09-30 NOTE — Progress Notes (Signed)
Quick Note:  I spoke with pt ______ 

## 2011-09-30 NOTE — Telephone Encounter (Signed)
Patient's spouse called and stated he would like a call back with mri results and will need a letter out of work for next week. Please assist. Can be reached [phone]

## 2011-09-30 NOTE — Telephone Encounter (Signed)
See the scan report. Okay to write the note for work as needed. I will refer him to PT for his back starting next week. Camelia Eng will call about the appt times. We will add a Medrol Dosepack to help reduce the inflammation in his back. Call in one pack to take as directed

## 2011-09-30 NOTE — Telephone Encounter (Signed)
I spoke with pt, went over results, sent in new script e-scribe and work note is ready for pick up.

## 2011-10-05 ENCOUNTER — Ambulatory Visit: Payer: 59 | Attending: Family Medicine

## 2011-10-05 DIAGNOSIS — R5381 Other malaise: Secondary | ICD-10-CM | POA: Insufficient documentation

## 2011-10-05 DIAGNOSIS — IMO0001 Reserved for inherently not codable concepts without codable children: Secondary | ICD-10-CM | POA: Insufficient documentation

## 2011-10-05 DIAGNOSIS — M545 Low back pain, unspecified: Secondary | ICD-10-CM | POA: Insufficient documentation

## 2011-10-06 ENCOUNTER — Ambulatory Visit: Payer: 59 | Admitting: Physical Therapy

## 2011-10-07 ENCOUNTER — Encounter: Payer: Self-pay | Admitting: Family Medicine

## 2011-10-07 ENCOUNTER — Ambulatory Visit (INDEPENDENT_AMBULATORY_CARE_PROVIDER_SITE_OTHER): Payer: 59 | Admitting: Family Medicine

## 2011-10-07 VITALS — BP 128/80 | HR 79 | Temp 98.6°F | Wt 208.0 lb

## 2011-10-07 DIAGNOSIS — S335XXA Sprain of ligaments of lumbar spine, initial encounter: Secondary | ICD-10-CM

## 2011-10-07 DIAGNOSIS — S39012A Strain of muscle, fascia and tendon of lower back, initial encounter: Secondary | ICD-10-CM

## 2011-10-10 ENCOUNTER — Encounter: Payer: Self-pay | Admitting: Family Medicine

## 2011-10-10 NOTE — Progress Notes (Signed)
  Subjective:    Patient ID: Jonathan Howard, male    DOB: 1955-08-04, 56 y.o.   MRN: [id number]  HPI Here to follow up on low back pain. He was seen here on 09-28-11 with severe low back pain of sudden onset. He had a lumbar MRI which showed no herniated discs and no real neurologic impingement. He has been to 2 sessions of PT, and this has helped a lot. He took a course of steroids, along with Percocets and flexeril. Now the pain is more tolerable, and he is getting around much better.    Review of Systems  Constitutional: Negative.   Musculoskeletal: Positive for back pain.       Objective:   Physical Exam  Constitutional: He appears well-developed and well-nourished.       He moves stiffly but looks much better than last time  Musculoskeletal:       Mild tenderness in the lower back with reduced ROM. Negative SLR          Assessment & Plan:  Low back pain which is improving. He will continue getting PT 3 days a week. Switch to Aleve, to take 2 tablets bid. Recheck one week

## 2011-10-12 ENCOUNTER — Encounter: Payer: Self-pay | Admitting: Family Medicine

## 2011-10-12 ENCOUNTER — Ambulatory Visit (INDEPENDENT_AMBULATORY_CARE_PROVIDER_SITE_OTHER): Payer: 59 | Admitting: Family Medicine

## 2011-10-12 VITALS — BP 130/90 | HR 89 | Temp 99.5°F | Wt 209.0 lb

## 2011-10-12 DIAGNOSIS — M545 Low back pain, unspecified: Secondary | ICD-10-CM

## 2011-10-12 NOTE — Progress Notes (Signed)
  Subjective:    Patient ID: Jonathan Howard, male    DOB: [birth year], 56 y.o.   MRN: [id number]  HPI Here to recheck back pain and to get advice. We had tentatively planned for him to return to work on Monday, but he and his wife do not think he will be ready. 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 He asks if he could get some restrictions on lifting or pushing before he goes back to work.    Review of Systems  Constitutional: Negative.   Musculoskeletal: Positive for back pain.       Objective:   Physical Exam  Constitutional: He appears well-developed and well-nourished.       He gets up from the chair stiffly but can walk fairly easily.           Assessment & Plan:  His back pain is improving but I agreed with them that he can never return to the kind of physical demands that his previous position required. I wrote him a note that he can return to work tomorrow provided he not do any heavy lifting or pushing greater than 10 lbs. He will stay on Aleve for his back pain.

## 2012-02-02 ENCOUNTER — Encounter: Payer: Self-pay | Admitting: Family Medicine

## 2012-02-02 ENCOUNTER — Ambulatory Visit (INDEPENDENT_AMBULATORY_CARE_PROVIDER_SITE_OTHER): Payer: 59 | Admitting: Family Medicine

## 2012-02-02 VITALS — BP 128/80 | HR 69 | Temp 98.7°F | Wt 213.0 lb

## 2012-02-02 DIAGNOSIS — Z23 Encounter for immunization: Secondary | ICD-10-CM

## 2012-02-02 DIAGNOSIS — M25519 Pain in unspecified shoulder: Secondary | ICD-10-CM

## 2012-02-02 MED ORDER — DICLOFENAC SODIUM 75 MG PO TBEC: 75.0000 mg | DELAYED_RELEASE_TABLET | Freq: Two times a day (BID) | ORAL | Status: DC | PRN

## 2012-02-02 NOTE — Progress Notes (Signed)
  Subjective:    Patient ID: Jonathan Howard, male    DOB: 04-21-1955, 56 y.o.   MRN: [id number]  HPI Here for one month of bilateral shoulder pains. No recent trauma. No unusual activities. No neck pain. Advil helps. His low back pain has completely resolved.    Review of Systems  Constitutional: Negative.   Musculoskeletal: Positive for arthralgias.       Objective:   Physical Exam  Constitutional: He appears well-developed and well-nourished. No distress.  Musculoskeletal:       Both anterior shoulders are tender over the subacromial area. Full ROM. No crepitus           Assessment & Plan:  Probable impingement at the shoulders. Rest, heat. Try Diclofenac

## 2012-02-22 ENCOUNTER — Other Ambulatory Visit (INDEPENDENT_AMBULATORY_CARE_PROVIDER_SITE_OTHER): Payer: 59

## 2012-02-22 DIAGNOSIS — Z0000 Encounter for general adult medical examination without abnormal findings: Secondary | ICD-10-CM

## 2012-02-22 LAB — CBC WITH DIFFERENTIAL/PLATELET
HCT: 49.5 % (ref 39.0–52.0)
Lymphs Abs: 3.2 10*3/uL (ref 0.7–4.0)
MCV: 92.3 fl (ref 78.0–100.0)
Monocytes Absolute: 0.7 10*3/uL (ref 0.1–1.0)
Platelets: 262 10*3/uL (ref 150.0–400.0)
RDW: 12.7 % (ref 11.5–14.6)

## 2012-02-22 LAB — POCT URINALYSIS DIPSTICK
Bilirubin, UA: NEGATIVE
Ketones, UA: NEGATIVE
Leukocytes, UA: NEGATIVE
Spec Grav, UA: 1.015
pH, UA: 6.5

## 2012-02-22 LAB — LIPID PANEL
Cholesterol: 248 mg/dL — ABNORMAL HIGH (ref 0–200)
HDL: 30.4 mg/dL — ABNORMAL LOW (ref >39.00)
Total CHOL/HDL Ratio: 8
VLDL: 62.2 mg/dL — ABNORMAL HIGH (ref 0.0–40.0)

## 2012-02-22 LAB — HEPATIC FUNCTION PANEL
AST: 25 U/L (ref 0–37)
Albumin: 4.2 g/dL (ref 3.5–5.2)
Alkaline Phosphatase: 51 U/L (ref 39–117)
Bilirubin, Direct: 0.1 mg/dL (ref 0.0–0.3)
Total Protein: 6.9 g/dL (ref 6.0–8.3)

## 2012-02-22 LAB — BASIC METABOLIC PANEL
BUN: 13 mg/dL (ref 6–23)
Chloride: 105 mEq/L (ref 96–112)
Creatinine, Ser: 1.1 mg/dL (ref 0.4–1.5)
Glucose, Bld: 98 mg/dL (ref 70–99)
Potassium: 4.1 mEq/L (ref 3.5–5.1)

## 2012-02-27 NOTE — Progress Notes (Signed)
Quick Note:  I left voice message with results. ______ 

## 2012-02-29 ENCOUNTER — Encounter: Payer: Self-pay | Admitting: Family Medicine

## 2012-02-29 ENCOUNTER — Ambulatory Visit (INDEPENDENT_AMBULATORY_CARE_PROVIDER_SITE_OTHER): Payer: 59 | Admitting: Family Medicine

## 2012-02-29 VITALS — BP 128/88 | HR 84 | Temp 98.9°F | Ht 71.0 in | Wt 212.0 lb

## 2012-02-29 DIAGNOSIS — Z0000 Encounter for general adult medical examination without abnormal findings: Secondary | ICD-10-CM

## 2012-02-29 MED ORDER — ATORVASTATIN CALCIUM 20 MG PO TABS: 20.0000 mg | ORAL_TABLET | Freq: Every day | ORAL | Status: DC

## 2012-02-29 NOTE — Progress Notes (Signed)
  Subjective:    Patient ID: Jonathan Howard, male    DOB: 1955-10-08, 56 y.o.   MRN: [id number]  HPI 56 yr old male for a cpx. He feels well and has no concerns. His back pain has totally resolved. When we look at his labs and see the jump in his lipid levels, he admits to stopping the Lipitor many months ago.    Review of Systems  Constitutional: Negative.   HENT: Negative.   Eyes: Negative.   Respiratory: Negative.   Cardiovascular: Negative.   Gastrointestinal: Negative.   Genitourinary: Negative.   Musculoskeletal: Negative.   Skin: Negative.   Neurological: Negative.   Hematological: Negative.   Psychiatric/Behavioral: Negative.        Objective:   Physical Exam  Constitutional: He is oriented to person, place, and time. He appears well-developed and well-nourished. No distress.  HENT:  Head: Normocephalic and atraumatic.  Right Ear: External ear normal.  Left Ear: External ear normal.  Nose: Nose normal.  Mouth/Throat: Oropharynx is clear and moist. No oropharyngeal exudate.  Eyes: Conjunctivae normal and EOM are normal. Pupils are equal, round, and reactive to light. Right eye exhibits no discharge. Left eye exhibits no discharge. No scleral icterus.  Neck: Neck supple. No JVD present. No tracheal deviation present. No thyromegaly present.  Cardiovascular: Normal rate, regular rhythm, normal heart sounds and intact distal pulses.  Exam reveals no gallop and no friction rub.   No murmur heard. Pulmonary/Chest: Effort normal and breath sounds normal. No respiratory distress. He has no wheezes. He has no rales. He exhibits no tenderness.  Abdominal: Soft. Bowel sounds are normal. He exhibits no distension and no mass. There is no tenderness. There is no rebound and no guarding.  Genitourinary: Rectum normal, prostate normal and penis normal. Guaiac negative stool. No penile tenderness.  Musculoskeletal: Normal range of motion. He exhibits no edema and no tenderness.    Lymphadenopathy:    He has no cervical adenopathy.  Neurological: He is alert and oriented to person, place, and time. He has normal reflexes. No cranial nerve deficit. He exhibits normal muscle tone. Coordination normal.  Skin: Skin is warm and dry. No rash noted. He is not diaphoretic. No erythema. No pallor.  Psychiatric: He has a normal mood and affect. His behavior is normal. Judgment and thought content normal.          Assessment & Plan:  Well exam. We will get back on Lipitor and he knows not to stop it from now on.

## 2012-07-09 ENCOUNTER — Encounter: Payer: Self-pay | Admitting: Family Medicine

## 2012-07-09 ENCOUNTER — Ambulatory Visit (INDEPENDENT_AMBULATORY_CARE_PROVIDER_SITE_OTHER): Payer: 59 | Admitting: Family Medicine

## 2012-07-09 VITALS — BP 120/84 | HR 58 | Temp 98.7°F | Wt 218.0 lb

## 2012-07-09 DIAGNOSIS — M25519 Pain in unspecified shoulder: Secondary | ICD-10-CM

## 2012-07-09 MED ORDER — MELOXICAM 15 MG PO TABS: 15.0000 mg | ORAL_TABLET | Freq: Every day | ORAL | Status: DC

## 2012-07-09 NOTE — Progress Notes (Signed)
  Subjective:    Patient ID: Jonathan Howard, male    DOB: 1955/07/29, 57 y.o.   MRN: [id number]  HPI Here for continued bilateral shoulder pains. He has been taking Diclofenac and this helps for a few hours, but then the pain returns. No pains down the arms.    Review of Systems  Constitutional: Negative.   Musculoskeletal: Positive for arthralgias.       Objective:   Physical Exam  Constitutional: He appears well-developed and well-nourished.  Musculoskeletal:  Both anterior shoulders tender in the subacromial areas. No crepitus. Full ROM           Assessment & Plan:  Switch to Meloxicam. Refer to Orthopedics.

## 2013-03-27 ENCOUNTER — Other Ambulatory Visit: Payer: Self-pay | Admitting: Family Medicine

## 2013-04-15 ENCOUNTER — Other Ambulatory Visit (INDEPENDENT_AMBULATORY_CARE_PROVIDER_SITE_OTHER): Payer: 59

## 2013-04-15 DIAGNOSIS — Z0000 Encounter for general adult medical examination without abnormal findings: Secondary | ICD-10-CM

## 2013-04-15 LAB — BASIC METABOLIC PANEL
BUN: 12 mg/dL (ref 6–23)
Calcium: 9.3 mg/dL (ref 8.4–10.5)
GFR: 73 mL/min (ref >60.00)
Glucose, Bld: 96 mg/dL (ref 70–99)
Potassium: 4.1 mEq/L (ref 3.5–5.1)
Sodium: 144 mEq/L (ref 135–145)

## 2013-04-15 LAB — CBC WITH DIFFERENTIAL/PLATELET
Basophils Absolute: 0 10*3/uL (ref 0.0–0.1)
Eosinophils Relative: 3.1 % (ref 0.0–5.0)
HCT: 49.2 % (ref 39.0–52.0)
Lymphocytes Relative: 48.6 % — ABNORMAL HIGH (ref 12.0–46.0)
Lymphs Abs: 3.8 10*3/uL (ref 0.7–4.0)
Monocytes Relative: 7.8 % (ref 3.0–12.0)
Platelets: 269 10*3/uL (ref 150.0–400.0)
RDW: 12.8 % (ref 11.5–14.6)
WBC: 7.8 10*3/uL (ref 4.5–10.5)

## 2013-04-15 LAB — HEPATIC FUNCTION PANEL
ALT: 46 U/L (ref 0–53)
AST: 32 U/L (ref 0–37)
Albumin: 4.4 g/dL (ref 3.5–5.2)
Total Bilirubin: 1 mg/dL (ref 0.3–1.2)
Total Protein: 6.6 g/dL (ref 6.0–8.3)

## 2013-04-15 LAB — POCT URINALYSIS DIPSTICK
Nitrite, UA: NEGATIVE
Spec Grav, UA: 1.025
Urobilinogen, UA: 0.2
pH, UA: 5.5

## 2013-04-15 LAB — LIPID PANEL
Cholesterol: 165 mg/dL (ref 0–200)
HDL: 32 mg/dL — ABNORMAL LOW (ref >39.00)
Total CHOL/HDL Ratio: 5
Triglycerides: 253 mg/dL — ABNORMAL HIGH (ref 0.0–149.0)
VLDL: 50.6 mg/dL — ABNORMAL HIGH (ref 0.0–40.0)

## 2013-04-15 LAB — LDL CHOLESTEROL, DIRECT: Direct LDL: 110.7 mg/dL

## 2013-04-15 LAB — PSA: PSA: 0.39 ng/mL (ref 0.10–4.00)

## 2013-04-19 ENCOUNTER — Encounter: Payer: Self-pay | Admitting: Family Medicine

## 2013-04-19 ENCOUNTER — Ambulatory Visit (INDEPENDENT_AMBULATORY_CARE_PROVIDER_SITE_OTHER): Payer: 59 | Admitting: Family Medicine

## 2013-04-19 VITALS — BP 120/84 | HR 58 | Temp 97.9°F | Ht 71.0 in | Wt 218.0 lb

## 2013-04-19 DIAGNOSIS — Z0000 Encounter for general adult medical examination without abnormal findings: Secondary | ICD-10-CM

## 2013-04-19 MED ORDER — ATORVASTATIN CALCIUM 20 MG PO TABS: ORAL_TABLET | ORAL | Status: DC

## 2013-04-19 NOTE — Progress Notes (Signed)
   Subjective:    Patient ID: Jonathan Howard, male    DOB: [birth year], 58 y.o.   MRN: [id number]  HPI 59 yr old male for a cpx. He feels well except for chronic bilateral shoulder pain. He has been seeing [facility] orthopedics and has received shots to both shoulders. When we went over his lab results, he admits that he has been taking Lipitor only sporadically. His LDL is down but the TG remain high.    Review of Systems  Constitutional: Negative.   HENT: Negative.   Eyes: Negative.   Respiratory: Negative.   Cardiovascular: Negative.   Gastrointestinal: Negative.   Genitourinary: Negative.   Musculoskeletal: Negative.   Skin: Negative.   Neurological: Negative.   Psychiatric/Behavioral: Negative.        Objective:   Physical Exam  Constitutional: He is oriented to person, place, and time. He appears well-developed and well-nourished. No distress.  HENT:  Head: Normocephalic and atraumatic.  Right Ear: External ear normal.  Left Ear: External ear normal.  Nose: Nose normal.  Mouth/Throat: Oropharynx is clear and moist. No oropharyngeal exudate.  Eyes: Conjunctivae and EOM are normal. Pupils are equal, round, and reactive to light. Right eye exhibits no discharge. Left eye exhibits no discharge. No scleral icterus.  Neck: Neck supple. No JVD present. No tracheal deviation present. No thyromegaly present.  Cardiovascular: Normal rate, regular rhythm, normal heart sounds and intact distal pulses.  Exam reveals no gallop and no friction rub.   No murmur heard. EKG normal   Pulmonary/Chest: Effort normal and breath sounds normal. No respiratory distress. He has no wheezes. He has no rales. He exhibits no tenderness.  Abdominal: Soft. Bowel sounds are normal. He exhibits no distension and no mass. There is no tenderness. There is no rebound and no guarding.  Genitourinary: Rectum normal, prostate normal and penis normal. Guaiac negative stool. No penile tenderness.  Musculoskeletal:  Normal range of motion. He exhibits no edema and no tenderness.  Lymphadenopathy:    He has no cervical adenopathy.  Neurological: He is alert and oriented to person, place, and time. He has normal reflexes. No cranial nerve deficit. He exhibits normal muscle tone. Coordination normal.  Skin: Skin is warm and dry. No rash noted. He is not diaphoretic. No erythema. No pallor.  Psychiatric: He has a normal mood and affect. His behavior is normal. Judgment and thought content normal.          Assessment & Plan:  Well exam. He agreed to take his meds regularly.

## 2013-04-29 ENCOUNTER — Other Ambulatory Visit: Payer: Self-pay | Admitting: Family Medicine

## 2013-10-27 ENCOUNTER — Emergency Department (HOSPITAL_COMMUNITY)
Admission: EM | Admit: 2013-10-27 | Discharge: 2013-10-27 | Disposition: A | Discharge status: Home / Self Care | Payer: 59 | Source: Home / Self Care | Attending: Family Medicine | Admitting: Family Medicine

## 2013-10-27 ENCOUNTER — Encounter (HOSPITAL_COMMUNITY): Payer: Self-pay | Admitting: Emergency Medicine

## 2013-10-27 DIAGNOSIS — M109 Gout, unspecified: Secondary | ICD-10-CM

## 2013-10-27 DIAGNOSIS — M10072 Idiopathic gout, left ankle and foot: Secondary | ICD-10-CM

## 2013-10-27 LAB — BASIC METABOLIC PANEL
ANION GAP: 13 (ref 5–15)
BUN: 11 mg/dL (ref 6–23)
CALCIUM: 9.1 mg/dL (ref 8.4–10.5)
CHLORIDE: 104 meq/L (ref 96–112)
CO2: 28 meq/L (ref 19–32)
Creatinine, Ser: 0.96 mg/dL (ref 0.50–1.35)
GFR calc non Af Amer: >90 mL/min (ref >90)
Glucose, Bld: 110 mg/dL — ABNORMAL HIGH (ref 70–99)
Potassium: 4.2 mEq/L (ref 3.7–5.3)
SODIUM: 145 meq/L (ref 137–147)

## 2013-10-27 LAB — URIC ACID: URIC ACID, SERUM: 6.9 mg/dL (ref 4.0–7.8)

## 2013-10-27 MED ORDER — INDOMETHACIN 50 MG PO CAPS: 50.0000 mg | ORAL_CAPSULE | Freq: Three times a day (TID) | ORAL | Status: DC

## 2013-10-27 MED ORDER — COLCHICINE 0.6 MG PO TABS: 0.6000 mg | ORAL_TABLET | Freq: Two times a day (BID) | ORAL | Status: DC

## 2013-10-27 NOTE — ED Notes (Signed)
C/O gout flare-up in left great toe x 2 days.  Last episode was approx 3 yrs ago.  Pt using crutches to ambulate due to pain.  Has tried Tylenol without relief.

## 2013-10-27 NOTE — Discharge Instructions (Signed)
Take medicine as prescribed for gout attack, call your doctor on mon for blood test results and further care as needed.

## 2013-10-27 NOTE — ED Provider Notes (Signed)
CSN: [id number]     Arrival date & time 10/27/13  0907 History   First MD Initiated Contact with Patient 10/27/13 [phone]     Chief Complaint  Patient presents with  . Gout   (Consider location/radiation/quality/duration/timing/severity/associated sxs/prior Treatment) Patient is a 58 y.o. male presenting with toe pain. The history is provided by the patient.  Toe Pain This is a recurrent problem. The current episode started 2 days ago (h/o gout last episode 3 yr ago. left gt toe.). The problem has been gradually worsening. Pertinent negatives include no chest pain and no abdominal pain. The symptoms are aggravated by walking.    Past Medical History  Diagnosis Date  . Hyperlipidemia   . Hypertension   . Gout   . Hyperglycemia    Past Surgical History  Procedure Laterality Date  . Appendectomy    . Colonoscopy  11-20-09    per Dr. Deatra Ina, clear, repeat 10 yrs  . Strabismus surgery     Family History  Problem Relation Age of Onset  . Alcohol abuse    . Arthritis    . Coronary artery disease    . Diabetes    . Hypertension     History  Substance Use Topics  . Smoking status: Never Smoker   . Smokeless tobacco: Never Used  . Alcohol Use: No    Review of Systems  Constitutional: Negative.   Cardiovascular: Negative for chest pain.  Gastrointestinal: Negative for abdominal pain.  Musculoskeletal: Positive for gait problem and joint swelling.  Skin: Negative.     Allergies  Review of patient's allergies indicates no known allergies.  Home Medications   Prior to Admission medications   Medication Sig Start Date End Date Taking? Authorizing Provider  atorvastatin (LIPITOR) 20 MG tablet TAKE 1 TABLET (20 MG TOTAL) BY MOUTH DAILY. 04/19/13  Yes Laurey Morale, MD  colchicine 0.6 MG tablet Take 1 tablet (0.6 mg total) by mouth 2 (two) times daily. 10/27/13   Billy Fischer, MD  indomethacin (INDOCIN) 50 MG capsule Take 1 capsule (50 mg total) by mouth 3 (three) times daily with  meals. 10/27/13   Billy Fischer, MD  meloxicam (MOBIC) 15 MG tablet Take 1 tablet (15 mg total) by mouth daily. 07/09/12   Laurey Morale, MD   BP 135/89  Pulse 54  Temp(Src) 98.1 F (36.7 C) (Oral)  Resp 14  SpO2 100% Physical Exam  Nursing note and vitals reviewed. Constitutional: He is oriented to person, place, and time. He appears well-developed and well-nourished.  Musculoskeletal: He exhibits tenderness.       Feet:  Neurological: He is alert and oriented to person, place, and time.  Skin: Skin is warm and dry.    ED Course  Procedures (including critical care time) Labs Review Labs Reviewed  URIC ACID  BASIC METABOLIC PANEL    Imaging Review No results found.   MDM   1. Acute idiopathic gout of left foot        Billy Fischer, MD 10/27/13 [phone]

## 2013-10-29 ENCOUNTER — Telehealth: Payer: Self-pay | Admitting: Family Medicine

## 2013-10-29 NOTE — Telephone Encounter (Signed)
His uric acid level was normal

## 2013-10-29 NOTE — Telephone Encounter (Signed)
Pt call want to know if his Uric Acid is high .He stated that he went to [facility] urgent [facility] on Sunday and would like a call back at [phone].

## 2013-10-30 NOTE — Telephone Encounter (Signed)
I spoke with pt  

## 2013-10-30 NOTE — ED Notes (Signed)
Uric acid 6.9.  Treated with Colchicine and Indocin.  Not sure if pt. is aware of the result.  Will try to call pt. Jonathan Howard 10/30/2013

## 2013-10-31 ENCOUNTER — Telehealth (HOSPITAL_COMMUNITY): Payer: Self-pay | Admitting: *Deleted

## 2013-10-31 NOTE — ED Notes (Signed)
Uric Acid 6.9.  Pt. treated with Colchicine and Indocin.  I called and left a message to call.  Call 1. Roselyn Meier 10/31/2013

## 2013-11-01 NOTE — ED Notes (Signed)
Patient returned call from message left on machine. Discussed lab reports. Stated he spoke w his PCP office and he was instructed his reports were okay, and he is feeling better

## 2014-01-14 ENCOUNTER — Encounter: Payer: Self-pay | Admitting: Gastroenterology

## 2014-05-06 ENCOUNTER — Other Ambulatory Visit: Payer: Self-pay | Admitting: Family Medicine

## 2014-08-07 ENCOUNTER — Ambulatory Visit (INDEPENDENT_AMBULATORY_CARE_PROVIDER_SITE_OTHER): Payer: 59 | Admitting: Adult Health

## 2014-08-07 ENCOUNTER — Telehealth: Payer: Self-pay | Admitting: Adult Health

## 2014-08-07 VITALS — BP 132/86 | Temp 98.4°F | Wt 215.1 lb

## 2014-08-07 DIAGNOSIS — M25519 Pain in unspecified shoulder: Secondary | ICD-10-CM | POA: Diagnosis not present

## 2014-08-07 DIAGNOSIS — E785 Hyperlipidemia, unspecified: Secondary | ICD-10-CM

## 2014-08-07 DIAGNOSIS — M109 Gout, unspecified: Secondary | ICD-10-CM | POA: Diagnosis not present

## 2014-08-07 LAB — CBC
HCT: 48.9 % (ref 39.0–52.0)
Hemoglobin: 17.1 g/dL — ABNORMAL HIGH (ref 13.0–17.0)
MCHC: 35 g/dL (ref 30.0–36.0)
MCV: 89.4 fl (ref 78.0–100.0)
Platelets: 288 10*3/uL (ref 150.0–400.0)
RBC: 5.47 Mil/uL (ref 4.22–5.81)
RDW: 12.9 % (ref 11.5–15.5)
WBC: 9 10*3/uL (ref 4.0–10.5)

## 2014-08-07 LAB — URIC ACID: URIC ACID, SERUM: 6.9 mg/dL (ref 4.0–7.8)

## 2014-08-07 MED ORDER — METHYLPREDNISOLONE ACETATE 80 MG/ML IJ SUSP
80.0000 mg | Freq: Once | INTRAMUSCULAR | Status: AC
  Administered 2014-08-07: 80 mg via INTRAMUSCULAR

## 2014-08-07 MED ORDER — INDOMETHACIN 50 MG PO CAPS: 50.0000 mg | ORAL_CAPSULE | Freq: Three times a day (TID) | ORAL | Status: DC

## 2014-08-07 NOTE — Telephone Encounter (Signed)
Patient asked to come in for fasting Lipid. When lab spun the blood today they noticed high levels of lipids in his blood. He is agreeable   By Dorothyann Peng, NP

## 2014-08-07 NOTE — Progress Notes (Signed)
Pre visit review using our clinic review tool, if applicable. No additional management support is needed unless otherwise documented below in the visit note. 

## 2014-08-07 NOTE — Patient Instructions (Addendum)
I sent a prescription for Indomethacin to the pharmacy for pain relief. I also placed a referral to sports medicine for you. If there is anything out of the ordinary with your blood work, I will let you know. Please follow up with me if you notice no improvement or worsening symptoms.   Gout Gout is an inflammatory arthritis caused by a buildup of uric acid crystals in the joints. Uric acid is a chemical that is normally present in the blood. When the level of uric acid in the blood is too high it can form crystals that deposit in your joints and tissues. This causes joint redness, soreness, and swelling (inflammation). Repeat attacks are common. Over time, uric acid crystals can form into masses (tophi) near a joint, destroying bone and causing disfigurement. Gout is treatable and often preventable. CAUSES  The disease begins with elevated levels of uric acid in the blood. Uric acid is produced by your body when it breaks down a naturally found substance called purines. Certain foods you eat, such as meats and fish, contain high amounts of purines. Causes of an elevated uric acid level include:  Being passed down from parent to child (heredity).  Diseases that cause increased uric acid production (such as obesity, psoriasis, and certain cancers).  Excessive alcohol use.  Diet, especially diets rich in meat and seafood.  Medicines, including certain cancer-fighting medicines (chemotherapy), water pills (diuretics), and aspirin.  Chronic kidney disease. The kidneys are no longer able to remove uric acid well.  Problems with metabolism. Conditions strongly associated with gout include:  Obesity.  High blood pressure.  High cholesterol.  Diabetes. Not everyone with elevated uric acid levels gets gout. It is not understood why some people get gout and others do not. Surgery, joint injury, and eating too much of certain foods are some of the factors that can lead to gout attacks. SYMPTOMS    An attack of gout comes on quickly. It causes intense pain with redness, swelling, and warmth in a joint.  Fever can occur.  Often, only one joint is involved. Certain joints are more commonly involved:  Base of the big toe.  Knee.  Ankle.  Wrist.  Finger. Without treatment, an attack usually goes away in a few days to weeks. Between attacks, you usually will not have symptoms, which is different from many other forms of arthritis. DIAGNOSIS  Your caregiver will suspect gout based on your symptoms and exam. In some cases, tests may be recommended. The tests may include:  Blood tests.  Urine tests.  X-rays.  Joint fluid exam. This exam requires a needle to remove fluid from the joint (arthrocentesis). Using a microscope, gout is confirmed when uric acid crystals are seen in the joint fluid. TREATMENT  There are two phases to gout treatment: treating the sudden onset (acute) attack and preventing attacks (prophylaxis).  Treatment of an Acute Attack.  Medicines are used. These include anti-inflammatory medicines or steroid medicines.  An injection of steroid medicine into the affected joint is sometimes necessary.  The painful joint is rested. Movement can worsen the arthritis.  You may use warm or cold treatments on painful joints, depending which works best for you.  Treatment to Prevent Attacks.  If you suffer from frequent gout attacks, your caregiver may advise preventive medicine. These medicines are started after the acute attack subsides. These medicines either help your kidneys eliminate uric acid from your body or decrease your uric acid production. You may need to stay on  these medicines for a very long time.  The early phase of treatment with preventive medicine can be associated with an increase in acute gout attacks. For this reason, during the first few months of treatment, your caregiver may also advise you to take medicines usually used for acute gout  treatment. Be sure you understand your caregiver's directions. Your caregiver may make several adjustments to your medicine dose before these medicines are effective.  Discuss dietary treatment with your caregiver or dietitian. Alcohol and drinks high in sugar and fructose and foods such as meat, poultry, and seafood can increase uric acid levels. Your caregiver or dietitian can advise you on drinks and foods that should be limited. HOME CARE INSTRUCTIONS   Do not take aspirin to relieve pain. This raises uric acid levels.  Only take over-the-counter or prescription medicines for pain, discomfort, or fever as directed by your caregiver.  Rest the joint as much as possible. When in bed, keep sheets and blankets off painful areas.  Keep the affected joint raised (elevated).  Apply warm or cold treatments to painful joints. Use of warm or cold treatments depends on which works best for you.  Use crutches if the painful joint is in your leg.  Drink enough fluids to keep your urine clear or pale yellow. This helps your body get rid of uric acid. Limit alcohol, sugary drinks, and fructose drinks.  Follow your dietary instructions. Pay careful attention to the amount of protein you eat. Your daily diet should emphasize fruits, vegetables, whole grains, and fat-free or low-fat milk products. Discuss the use of coffee, vitamin C, and cherries with your caregiver or dietitian. These may be helpful in lowering uric acid levels.  Maintain a healthy body weight. SEEK MEDICAL CARE IF:   You develop diarrhea, vomiting, or any side effects from medicines.  You do not feel better in 24 hours, or you are getting worse. SEEK IMMEDIATE MEDICAL CARE IF:   Your joint becomes suddenly more tender, and you have chills or a fever. MAKE SURE YOU:   Understand these instructions.  Will watch your condition.  Will get help right away if you are not doing well or get worse. Document Released: 04/01/2000  Document Revised: 08/19/2013 Document Reviewed: 11/16/2011 [facility] Patient Information 2015 [place], [place]. This information is not intended to replace advice given to you by your health care provider. Make sure you discuss any questions you have with your health care provider.

## 2014-08-07 NOTE — Progress Notes (Signed)
Subjective:     Patient ID: Jonathan Howard, male   DOB: 1955/08/20, 59 y.o.   MRN: [id number]  HPI Jonathan Howard presents to the office today with suspected gout flares. He endorses having multiple gout flares in the past and they always feel like this and are in the same place each time ( right big toe). He endorses that this flare started yesterday.   Would also like a referral to sports medicine for his chronic bilateral shoulder pain. He has seen sports medicine in the past and would like to see if they can help him again.   Review of Systems  Musculoskeletal: Positive for joint swelling and gait problem.  Skin: Positive for color change.  All other systems reviewed and are negative.  Past Medical History  Diagnosis Date  . Hyperlipidemia   . Hypertension   . Gout   . Hyperglycemia     History   Social History  . Marital Status: Married    Spouse Name: N/A  . Number of Children: N/A  . Years of Education: N/A   Occupational History  . Not on file.   Social History Main Topics  . Smoking status: Never Smoker   . Smokeless tobacco: Never Used  . Alcohol Use: No  . Drug Use: No  . Sexual Activity: Not on file   Other Topics Concern  . Not on file   Social History Narrative  . No narrative on file    Past Surgical History  Procedure Laterality Date  . Appendectomy    . Colonoscopy  11-20-09    per Dr. Deatra Ina, clear, repeat 10 yrs  . Strabismus surgery      Family History  Problem Relation Age of Onset  . Alcohol abuse    . Arthritis    . Coronary artery disease    . Diabetes    . Hypertension      No Known Allergies  Current Outpatient Prescriptions on File Prior to Visit  Medication Sig Dispense Refill  . atorvastatin (LIPITOR) 20 MG tablet TAKE 1 TABLET (20 MG TOTAL) BY MOUTH DAILY. 30 tablet 6  . colchicine 0.6 MG tablet Take 1 tablet (0.6 mg total) by mouth 2 (two) times daily. 20 tablet 0  . meloxicam (MOBIC) 15 MG tablet Take 1 tablet (15 mg total)  by mouth daily. (Patient not taking: Reported on 08/07/2014) 30 tablet 5   No current facility-administered medications on file prior to visit.    BP 132/86 mmHg  Temp(Src) 98.4 F (36.9 C) (Oral)  Ht   Wt 215 lb 1.6 oz (97.569 kg)        Objective:   Physical Exam  Constitutional: He appears well-developed and well-nourished. He appears distressed.  Musculoskeletal: Normal range of motion. He exhibits edema and tenderness.  right great toe pain  Skin: Skin is warm and dry. There is erythema.  Redness, warmth and swelling to right big toe.   Nursing note and vitals reviewed.      Assessment:     1. Pain in joint, shoulder region, unspecified laterality - Ambulatory referral to Sports Medicine  2. Gout of right foot, unspecified cause, unspecified chronicity - Uric Acid - CBC      Plan:     1. Pain in joint, shoulder region, unspecified laterality  - Ambulatory referral to Sports Medicine  2. Gout of right foot, unspecified cause, unspecified chronicity - Uric Acid - CBC  - Depo Medrol 80 mg given in office -  Indomethacin 50mg  PO BID as needed for pain relief.  - Follow up in office on Monday if gout flare is not improved.

## 2014-08-08 ENCOUNTER — Other Ambulatory Visit: Payer: Self-pay | Admitting: Adult Health

## 2014-08-08 DIAGNOSIS — E785 Hyperlipidemia, unspecified: Secondary | ICD-10-CM

## 2014-08-08 LAB — LIPID PANEL
CHOL/HDL RATIO: 7
Cholesterol: 227 mg/dL — ABNORMAL HIGH (ref 0–200)
HDL: 33.9 mg/dL — AB (ref >39.00)
NONHDL: 193.1
TRIGLYCERIDES: 364 mg/dL — AB (ref 0.0–149.0)
VLDL: 72.8 mg/dL — ABNORMAL HIGH (ref 0.0–40.0)

## 2014-08-08 LAB — LDL CHOLESTEROL, DIRECT: LDL DIRECT: 148 mg/dL

## 2014-08-08 NOTE — Addendum Note (Signed)
Addended by: Townsend Roger D on: 08/08/2014 09:22 AM   Modules accepted: Orders

## 2014-08-22 ENCOUNTER — Ambulatory Visit: Payer: 59 | Admitting: Family Medicine

## 2014-10-07 ENCOUNTER — Telehealth: Payer: Self-pay | Admitting: Family Medicine

## 2014-10-07 NOTE — Telephone Encounter (Signed)
Pt saw cory on 08-07-14 and had labwork and needs results

## 2014-10-08 NOTE — Telephone Encounter (Signed)
Do you mind sending him his blood work?

## 2014-10-09 NOTE — Telephone Encounter (Signed)
Are the labs stable?

## 2014-10-09 NOTE — Telephone Encounter (Signed)
Called and spoke with pt and pt is aware.  

## 2014-10-09 NOTE — Telephone Encounter (Signed)
Pt has an upcoming appt with Dr. Sarajane Jews on 7.21.2016.

## 2014-10-09 NOTE — Telephone Encounter (Signed)
Labs mailed to pt's home address.

## 2014-10-09 NOTE — Telephone Encounter (Signed)
His cholesterol and triglycerides are high. I would recommend he talk to Dr. Sarajane Jews about a statin medication. Dr. Sarajane Jews is aware

## 2014-10-30 ENCOUNTER — Other Ambulatory Visit (INDEPENDENT_AMBULATORY_CARE_PROVIDER_SITE_OTHER): Payer: 59

## 2014-10-30 DIAGNOSIS — R7989 Other specified abnormal findings of blood chemistry: Secondary | ICD-10-CM | POA: Diagnosis not present

## 2014-10-30 DIAGNOSIS — Z0000 Encounter for general adult medical examination without abnormal findings: Secondary | ICD-10-CM | POA: Diagnosis not present

## 2014-10-30 LAB — POCT URINALYSIS DIPSTICK
Bilirubin, UA: NEGATIVE
Glucose, UA: NEGATIVE
Ketones, UA: NEGATIVE
LEUKOCYTES UA: NEGATIVE
NITRITE UA: NEGATIVE
Protein, UA: NEGATIVE
Spec Grav, UA: 1.015
UROBILINOGEN UA: 0.2
pH, UA: 6.5

## 2014-10-30 LAB — CBC WITH DIFFERENTIAL/PLATELET
BASOS ABS: 0 10*3/uL (ref 0.0–0.1)
Basophils Relative: 0.6 % (ref 0.0–3.0)
EOS PCT: 2.8 % (ref 0.0–5.0)
Eosinophils Absolute: 0.2 10*3/uL (ref 0.0–0.7)
HCT: 50 % (ref 39.0–52.0)
Hemoglobin: 17 g/dL (ref 13.0–17.0)
LYMPHS ABS: 4 10*3/uL (ref 0.7–4.0)
LYMPHS PCT: 47.7 % — AB (ref 12.0–46.0)
MCHC: 34.1 g/dL (ref 30.0–36.0)
MCV: 92.3 fl (ref 78.0–100.0)
Monocytes Absolute: 0.7 10*3/uL (ref 0.1–1.0)
Monocytes Relative: 8.4 % (ref 3.0–12.0)
NEUTROS PCT: 40.5 % — AB (ref 43.0–77.0)
Neutro Abs: 3.4 10*3/uL (ref 1.4–7.7)
PLATELETS: 269 10*3/uL (ref 150.0–400.0)
RBC: 5.42 Mil/uL (ref 4.22–5.81)
RDW: 12.7 % (ref 11.5–15.5)
WBC: 8.3 10*3/uL (ref 4.0–10.5)

## 2014-10-30 LAB — COMPREHENSIVE METABOLIC PANEL
ALT: 30 U/L (ref 0–53)
AST: 20 U/L (ref 0–37)
Albumin: 4.5 g/dL (ref 3.5–5.2)
Alkaline Phosphatase: 65 U/L (ref 39–117)
BILIRUBIN TOTAL: 0.6 mg/dL (ref 0.2–1.2)
BUN: 17 mg/dL (ref 6–23)
CALCIUM: 9.4 mg/dL (ref 8.4–10.5)
CHLORIDE: 103 meq/L (ref 96–112)
CO2: 29 meq/L (ref 19–32)
CREATININE: 1.08 mg/dL (ref 0.40–1.50)
GFR: 74.92 mL/min (ref >60.00)
GLUCOSE: 99 mg/dL (ref 70–99)
POTASSIUM: 4.2 meq/L (ref 3.5–5.1)
Sodium: 141 mEq/L (ref 135–145)
Total Protein: 6.9 g/dL (ref 6.0–8.3)

## 2014-10-30 LAB — TSH: TSH: 3.37 u[IU]/mL (ref 0.35–4.50)

## 2014-10-30 LAB — LIPID PANEL
Cholesterol: 159 mg/dL (ref 0–200)
HDL: 36.1 mg/dL — AB (ref >39.00)
NonHDL: 122.9
Total CHOL/HDL Ratio: 4
Triglycerides: 330 mg/dL — ABNORMAL HIGH (ref 0.0–149.0)
VLDL: 66 mg/dL — ABNORMAL HIGH (ref 0.0–40.0)

## 2014-10-30 LAB — PSA: PSA: 0.49 ng/mL (ref 0.10–4.00)

## 2014-10-30 LAB — LDL CHOLESTEROL, DIRECT: Direct LDL: 93 mg/dL

## 2014-11-06 ENCOUNTER — Ambulatory Visit (INDEPENDENT_AMBULATORY_CARE_PROVIDER_SITE_OTHER): Payer: 59 | Admitting: Family Medicine

## 2014-11-06 ENCOUNTER — Encounter: Payer: Self-pay | Admitting: Family Medicine

## 2014-11-06 VITALS — BP 125/76 | HR 53 | Temp 98.3°F | Ht 71.0 in | Wt 214.0 lb

## 2014-11-06 DIAGNOSIS — Z0000 Encounter for general adult medical examination without abnormal findings: Secondary | ICD-10-CM

## 2014-11-06 MED ORDER — ATORVASTATIN CALCIUM 20 MG PO TABS: ORAL_TABLET | ORAL | Status: DC

## 2014-11-06 NOTE — Progress Notes (Signed)
   Subjective:    Patient ID: Jonathan Howard, male    DOB: 29-Dec-1955, 59 y.o.   MRN: [id number]  HPI 59 yr old male for a cpx. He feels well in general. He still has stiffness and pain in the shoulders at times. He also mentions some knots that ave developed in the right hand. They are not painful at this point.    Review of Systems  Constitutional: Negative.   HENT: Negative.   Eyes: Negative.   Respiratory: Negative.   Cardiovascular: Negative.   Gastrointestinal: Negative.   Genitourinary: Negative.   Musculoskeletal: Positive for arthralgias. Negative for myalgias, back pain, joint swelling, gait problem, neck pain and neck stiffness.  Skin: Negative.   Neurological: Negative.   Psychiatric/Behavioral: Negative.        Objective:   Physical Exam  Constitutional: He is oriented to person, place, and time. He appears well-developed and well-nourished. No distress.  HENT:  Head: Normocephalic and atraumatic.  Right Ear: External ear normal.  Left Ear: External ear normal.  Nose: Nose normal.  Mouth/Throat: Oropharynx is clear and moist. No oropharyngeal exudate.  Eyes: Conjunctivae and EOM are normal. Pupils are equal, round, and reactive to light. Right eye exhibits no discharge. Left eye exhibits no discharge. No scleral icterus.  Neck: Neck supple. No JVD present. No tracheal deviation present. No thyromegaly present.  Cardiovascular: Normal rate, regular rhythm, normal heart sounds and intact distal pulses.  Exam reveals no gallop and no friction rub.   No murmur heard. Pulmonary/Chest: Effort normal and breath sounds normal. No respiratory distress. He has no wheezes. He has no rales. He exhibits no tenderness.  Abdominal: Soft. Bowel sounds are normal. He exhibits no distension and no mass. There is no tenderness. There is no rebound and no guarding.  Genitourinary: Rectum normal, prostate normal and penis normal. Guaiac negative stool. No penile tenderness.    Musculoskeletal: Normal range of motion. He exhibits no edema or tenderness.  The right hand has palmar nodules that are not tender   Lymphadenopathy:    He has no cervical adenopathy.  Neurological: He is alert and oriented to person, place, and time. He has normal reflexes. No cranial nerve deficit. He exhibits normal muscle tone. Coordination normal.  Skin: Skin is warm and dry. No rash noted. He is not diaphoretic. No erythema. No pallor.  Psychiatric: He has a normal mood and affect. His behavior is normal. Judgment and thought content normal.          Assessment & Plan:  Well exam. We discussed diet and exercise advice. He will let me know if the palmar nodules become a problem.

## 2014-11-06 NOTE — Progress Notes (Signed)
Pre visit review using our clinic review tool, if applicable. No additional management support is needed unless otherwise documented below in the visit note. Waist measurement 39 inches

## 2015-06-09 ENCOUNTER — Other Ambulatory Visit: Payer: Self-pay | Admitting: Family Medicine

## 2015-06-09 MED ORDER — ATORVASTATIN CALCIUM 20 MG PO TABS: ORAL_TABLET | ORAL | Status: DC

## 2015-09-23 ENCOUNTER — Ambulatory Visit (INDEPENDENT_AMBULATORY_CARE_PROVIDER_SITE_OTHER): Payer: 59 | Admitting: Family Medicine

## 2015-09-23 ENCOUNTER — Encounter: Payer: Self-pay | Admitting: Family Medicine

## 2015-09-23 VITALS — BP 150/86 | HR 67 | Temp 98.2°F | Wt 216.7 lb

## 2015-09-23 DIAGNOSIS — M7712 Lateral epicondylitis, left elbow: Secondary | ICD-10-CM | POA: Diagnosis not present

## 2015-09-23 DIAGNOSIS — M25512 Pain in left shoulder: Secondary | ICD-10-CM

## 2015-09-23 MED ORDER — MELOXICAM 15 MG PO TABS: 15.0000 mg | ORAL_TABLET | Freq: Every day | ORAL | Status: DC

## 2015-09-23 NOTE — Progress Notes (Signed)
   Subjective:    Patient ID: Jonathan Howard, male    DOB: [birth year], 58 y.o.   MRN: [id number]  HPI Here for chronic pain in the left shoulder and now several weeks of pain in the left elbow. He has had pain in both shoulders for years and last year he had steroid injections in both of them. The shot in the right shoulder was successful but the one in the left shoulder did not help at all. Also since helping his son move a couch he has had pain in the left elbow. Using Ibuprofen.    Review of Systems  Constitutional: Negative.   Musculoskeletal: Positive for arthralgias.       Objective:   Physical Exam  Constitutional: He is oriented to person, place, and time. He appears well-developed and well-nourished.  Musculoskeletal:  Tender in the anterior left shoulder with full ROM. Tender over the left lateral epicondyle with full ROM  Neurological: He is alert and oriented to person, place, and time.          Assessment & Plan:  Chronic shoulder pain and now also tennis elbow. Get back on Meloxicam daily. Ice packs prn. Refer to Orthopedics. Laurey Morale, MD

## 2016-05-10 ENCOUNTER — Other Ambulatory Visit (INDEPENDENT_AMBULATORY_CARE_PROVIDER_SITE_OTHER): Payer: 59

## 2016-05-10 DIAGNOSIS — R7989 Other specified abnormal findings of blood chemistry: Secondary | ICD-10-CM

## 2016-05-10 DIAGNOSIS — Z0000 Encounter for general adult medical examination without abnormal findings: Secondary | ICD-10-CM

## 2016-05-10 LAB — CBC WITH DIFFERENTIAL/PLATELET
BASOS PCT: 0.4 % (ref 0.0–3.0)
Basophils Absolute: 0 10*3/uL (ref 0.0–0.1)
EOS ABS: 0.3 10*3/uL (ref 0.0–0.7)
EOS PCT: 3.8 % (ref 0.0–5.0)
HEMATOCRIT: 46.8 % (ref 39.0–52.0)
Hemoglobin: 16.4 g/dL (ref 13.0–17.0)
LYMPHS PCT: 48.2 % — AB (ref 12.0–46.0)
Lymphs Abs: 3.7 10*3/uL (ref 0.7–4.0)
MCHC: 34.9 g/dL (ref 30.0–36.0)
MCV: 89.3 fl (ref 78.0–100.0)
MONO ABS: 0.8 10*3/uL (ref 0.1–1.0)
Monocytes Relative: 10 % (ref 3.0–12.0)
Neutro Abs: 2.9 10*3/uL (ref 1.4–7.7)
Neutrophils Relative %: 37.6 % — ABNORMAL LOW (ref 43.0–77.0)
Platelets: 283 10*3/uL (ref 150.0–400.0)
RBC: 5.24 Mil/uL (ref 4.22–5.81)
RDW: 12.6 % (ref 11.5–15.5)
WBC: 7.6 10*3/uL (ref 4.0–10.5)

## 2016-05-10 LAB — BASIC METABOLIC PANEL
BUN: 16 mg/dL (ref 6–23)
CHLORIDE: 103 meq/L (ref 96–112)
CO2: 32 mEq/L (ref 19–32)
Calcium: 9.4 mg/dL (ref 8.4–10.5)
Creatinine, Ser: 1.03 mg/dL (ref 0.40–1.50)
GFR: 78.71 mL/min (ref >60.00)
Glucose, Bld: 110 mg/dL — ABNORMAL HIGH (ref 70–99)
POTASSIUM: 4.2 meq/L (ref 3.5–5.1)
Sodium: 140 mEq/L (ref 135–145)

## 2016-05-10 LAB — LIPID PANEL
CHOL/HDL RATIO: 4
Cholesterol: 148 mg/dL (ref 0–200)
HDL: 34.5 mg/dL — ABNORMAL LOW (ref >39.00)
NonHDL: 113.04
Triglycerides: 223 mg/dL — ABNORMAL HIGH (ref 0.0–149.0)
VLDL: 44.6 mg/dL — ABNORMAL HIGH (ref 0.0–40.0)

## 2016-05-10 LAB — POC URINALSYSI DIPSTICK (AUTOMATED)
Bilirubin, UA: NEGATIVE
GLUCOSE UA: NEGATIVE
Ketones, UA: NEGATIVE
LEUKOCYTES UA: NEGATIVE
Nitrite, UA: NEGATIVE
Spec Grav, UA: 1.02
UROBILINOGEN UA: 0.2
pH, UA: 6

## 2016-05-10 LAB — LDL CHOLESTEROL, DIRECT: Direct LDL: 86 mg/dL

## 2016-05-10 LAB — HEPATIC FUNCTION PANEL
ALT: 48 U/L (ref 0–53)
AST: 30 U/L (ref 0–37)
Albumin: 4.4 g/dL (ref 3.5–5.2)
Alkaline Phosphatase: 58 U/L (ref 39–117)
BILIRUBIN DIRECT: 0.2 mg/dL (ref 0.0–0.3)
BILIRUBIN TOTAL: 0.8 mg/dL (ref 0.2–1.2)
TOTAL PROTEIN: 7 g/dL (ref 6.0–8.3)

## 2016-05-10 LAB — TSH: TSH: 3.22 u[IU]/mL (ref 0.35–4.50)

## 2016-05-10 LAB — PSA: PSA: 0.45 ng/mL (ref 0.10–4.00)

## 2016-05-17 ENCOUNTER — Ambulatory Visit (INDEPENDENT_AMBULATORY_CARE_PROVIDER_SITE_OTHER): Payer: 59 | Admitting: Family Medicine

## 2016-05-17 ENCOUNTER — Encounter: Payer: Self-pay | Admitting: Family Medicine

## 2016-05-17 VITALS — BP 138/86 | HR 53 | Temp 97.8°F | Ht 71.0 in | Wt 221.0 lb

## 2016-05-17 DIAGNOSIS — Z0000 Encounter for general adult medical examination without abnormal findings: Secondary | ICD-10-CM | POA: Diagnosis not present

## 2016-05-17 MED ORDER — TRIAMCINOLONE ACETONIDE 0.1 % EX CREA: 1.0000 "application " | TOPICAL_CREAM | Freq: Two times a day (BID) | CUTANEOUS | 2 refills | Status: DC

## 2016-05-17 MED ORDER — ATORVASTATIN CALCIUM 20 MG PO TABS: ORAL_TABLET | ORAL | 3 refills | Status: DC

## 2016-05-17 NOTE — Progress Notes (Signed)
Pre visit review using our clinic review tool, if applicable. No additional management support is needed unless otherwise documented below in the visit note. 

## 2016-05-17 NOTE — Progress Notes (Signed)
   Subjective:    Patient ID: Jonathan Howard, male    DOB: [birth year], 61 y.o.   MRN: [id number]  HPI 61 yr old male for a well exam. He has a few questions today. First of all he has a patch of skin in the middle of his back that itches intensely. This started about a month ago. Using OTC moisturizers with poor results. Also he describes headaches that start immediately after having sex. These only happen about twice a year, and he cannot predict when they will occur. These headaches are intense but only last a few minutes at a time. No vision changes and no nausea.    Review of Systems  Constitutional: Negative.   HENT: Negative.   Eyes: Negative.   Respiratory: Negative.   Cardiovascular: Negative.   Gastrointestinal: Negative.   Genitourinary: Negative.   Musculoskeletal: Negative.   Skin: Negative.   Neurological: Positive for headaches. Negative for dizziness, tremors, seizures, syncope, facial asymmetry, speech difficulty, weakness, light-headedness and numbness.  Psychiatric/Behavioral: Negative.        Objective:   Physical Exam  Constitutional: He is oriented to person, place, and time. He appears well-developed and well-nourished. No distress.  HENT:  Head: Normocephalic and atraumatic.  Right Ear: External ear normal.  Left Ear: External ear normal.  Nose: Nose normal.  Mouth/Throat: Oropharynx is clear and moist. No oropharyngeal exudate.  Eyes: Conjunctivae and EOM are normal. Pupils are equal, round, and reactive to light. Right eye exhibits no discharge. Left eye exhibits no discharge. No scleral icterus.  Neck: Neck supple. No JVD present. No tracheal deviation present. No thyromegaly present.  Cardiovascular: Normal rate, regular rhythm, normal heart sounds and intact distal pulses.  Exam reveals no gallop and no friction rub.   No murmur heard. Pulmonary/Chest: Effort normal and breath sounds normal. No respiratory distress. He has no wheezes. He has no rales. He  exhibits no tenderness.  Abdominal: Soft. Bowel sounds are normal. He exhibits no distension and no mass. There is no tenderness. There is no rebound and no guarding.  Genitourinary: Rectum normal, prostate normal and penis normal. Rectal exam shows guaiac negative stool. No penile tenderness.  Musculoskeletal: Normal range of motion. He exhibits no edema or tenderness.  Lymphadenopathy:    He has no cervical adenopathy.  Neurological: He is alert and oriented to person, place, and time. He has normal reflexes. No cranial nerve deficit. He exhibits normal muscle tone. Coordination normal.  Skin: Skin is warm and dry. No rash noted. He is not diaphoretic. No erythema. No pallor.  The skin on his back appears normal   Psychiatric: He has a normal mood and affect. His behavior is normal. Judgment and thought content normal.          Assessment & Plan:  Well exam. We discussed diet and exercise. He probably has some eczema on the back and he will try Triamcinolone cream for this. He has orgasm headaches, and there is probably little we can do about them. thye are so infrequent he wil not consider taking a daily preventative medication. I advised him to be sure he stays hydrated prior to sex. Recheck prn.  Alysia Penna, MD

## 2016-11-21 ENCOUNTER — Telehealth: Payer: Self-pay | Admitting: Family Medicine

## 2016-11-21 DIAGNOSIS — M79641 Pain in right hand: Secondary | ICD-10-CM

## 2016-11-21 DIAGNOSIS — M79642 Pain in left hand: Principal | ICD-10-CM

## 2016-11-21 NOTE — Telephone Encounter (Signed)
Pt would like a referral to a hand doctor. Pt states he has pain in both hands, and indentions in his hands. Pt states he has spoken to the dr about this before. Declined to make an appointment, prefers referral.

## 2016-11-22 NOTE — Telephone Encounter (Signed)
The referral was done  

## 2016-11-23 NOTE — Telephone Encounter (Signed)
I left a voice message with below information. 

## 2017-08-02 ENCOUNTER — Ambulatory Visit (INDEPENDENT_AMBULATORY_CARE_PROVIDER_SITE_OTHER): Payer: 59 | Admitting: Family Medicine

## 2017-08-02 ENCOUNTER — Encounter: Payer: Self-pay | Admitting: Family Medicine

## 2017-08-02 VITALS — BP 140/84 | HR 53 | Temp 98.0°F | Ht 71.0 in | Wt 217.8 lb

## 2017-08-02 DIAGNOSIS — Z0000 Encounter for general adult medical examination without abnormal findings: Secondary | ICD-10-CM | POA: Diagnosis not present

## 2017-08-02 LAB — POC URINALSYSI DIPSTICK (AUTOMATED)
Bilirubin, UA: NEGATIVE
Blood, UA: NEGATIVE
Glucose, UA: NEGATIVE
Ketones, UA: NEGATIVE
LEUKOCYTES UA: NEGATIVE
NITRITE UA: NEGATIVE
PH UA: 6 (ref 5.0–8.0)
Spec Grav, UA: 1.025 (ref 1.010–1.025)
UROBILINOGEN UA: 0.2 U/dL

## 2017-08-02 LAB — BASIC METABOLIC PANEL
BUN: 14 mg/dL (ref 6–23)
CHLORIDE: 103 meq/L (ref 96–112)
CO2: 28 mEq/L (ref 19–32)
CREATININE: 1.07 mg/dL (ref 0.40–1.50)
Calcium: 9 mg/dL (ref 8.4–10.5)
GFR: 75.01 mL/min (ref >60.00)
Glucose, Bld: 108 mg/dL — ABNORMAL HIGH (ref 70–99)
Potassium: 3.9 mEq/L (ref 3.5–5.1)
Sodium: 140 mEq/L (ref 135–145)

## 2017-08-02 LAB — LIPID PANEL
CHOLESTEROL: 234 mg/dL — AB (ref 0–200)
HDL: 32.6 mg/dL — ABNORMAL LOW (ref >39.00)
Total CHOL/HDL Ratio: 7
Triglycerides: 404 mg/dL — ABNORMAL HIGH (ref 0.0–149.0)

## 2017-08-02 LAB — CBC WITH DIFFERENTIAL/PLATELET
BASOS ABS: 0 10*3/uL (ref 0.0–0.1)
Basophils Relative: 0.6 % (ref 0.0–3.0)
EOS ABS: 0.3 10*3/uL (ref 0.0–0.7)
Eosinophils Relative: 3.8 % (ref 0.0–5.0)
HEMATOCRIT: 48 % (ref 39.0–52.0)
Hemoglobin: 16.9 g/dL (ref 13.0–17.0)
LYMPHS PCT: 48.3 % — AB (ref 12.0–46.0)
Lymphs Abs: 3.3 10*3/uL (ref 0.7–4.0)
MCHC: 35.2 g/dL (ref 30.0–36.0)
MCV: 89.5 fl (ref 78.0–100.0)
MONOS PCT: 8.7 % (ref 3.0–12.0)
Monocytes Absolute: 0.6 10*3/uL (ref 0.1–1.0)
NEUTROS PCT: 38.6 % — AB (ref 43.0–77.0)
Neutro Abs: 2.7 10*3/uL (ref 1.4–7.7)
Platelets: 248 10*3/uL (ref 150.0–400.0)
RBC: 5.36 Mil/uL (ref 4.22–5.81)
RDW: 13.1 % (ref 11.5–15.5)
WBC: 6.9 10*3/uL (ref 4.0–10.5)

## 2017-08-02 LAB — TSH: TSH: 3.55 u[IU]/mL (ref 0.35–4.50)

## 2017-08-02 LAB — HEPATIC FUNCTION PANEL
ALT: 57 U/L — AB (ref 0–53)
AST: 36 U/L (ref 0–37)
Albumin: 4.4 g/dL (ref 3.5–5.2)
Alkaline Phosphatase: 49 U/L (ref 39–117)
BILIRUBIN DIRECT: 0.1 mg/dL (ref 0.0–0.3)
BILIRUBIN TOTAL: 0.6 mg/dL (ref 0.2–1.2)
Total Protein: 6.9 g/dL (ref 6.0–8.3)

## 2017-08-02 LAB — PSA: PSA: 0.32 ng/mL (ref 0.10–4.00)

## 2017-08-02 LAB — LDL CHOLESTEROL, DIRECT: Direct LDL: 160 mg/dL

## 2017-08-02 MED ORDER — ATORVASTATIN CALCIUM 20 MG PO TABS: ORAL_TABLET | ORAL | 3 refills | Status: DC

## 2017-08-02 NOTE — Progress Notes (Signed)
   Subjective:    Patient ID: Jonathan Howard, male    DOB: [birth year], 62 y.o.   MRN: [id number]  HPI Here for a well exam. He feels fine.    Review of Systems  Constitutional: Negative.   HENT: Negative.   Eyes: Negative.   Respiratory: Negative.   Cardiovascular: Negative.   Gastrointestinal: Negative.   Genitourinary: Negative.   Musculoskeletal: Negative.   Skin: Negative.   Neurological: Negative.   Psychiatric/Behavioral: Negative.        Objective:   Physical Exam  Constitutional: He is oriented to person, place, and time. He appears well-developed and well-nourished. No distress.  HENT:  Head: Normocephalic and atraumatic.  Right Ear: External ear normal.  Left Ear: External ear normal.  Nose: Nose normal.  Mouth/Throat: Oropharynx is clear and moist. No oropharyngeal exudate.  Eyes: Pupils are equal, round, and reactive to light. Conjunctivae and EOM are normal. Right eye exhibits no discharge. Left eye exhibits no discharge. No scleral icterus.  Neck: Neck supple. No JVD present. No tracheal deviation present. No thyromegaly present.  Cardiovascular: Normal rate, regular rhythm, normal heart sounds and intact distal pulses. Exam reveals no gallop and no friction rub.  No murmur heard. Pulmonary/Chest: Effort normal and breath sounds normal. No respiratory distress. He has no wheezes. He has no rales. He exhibits no tenderness.  Abdominal: Soft. Bowel sounds are normal. He exhibits no distension and no mass. There is no tenderness. There is no rebound and no guarding.  Genitourinary: Rectum normal, prostate normal and penis normal. Rectal exam shows guaiac negative stool. No penile tenderness.  Musculoskeletal: Normal range of motion. He exhibits no edema or tenderness.  Lymphadenopathy:    He has no cervical adenopathy.  Neurological: He is alert and oriented to person, place, and time. He has normal reflexes. No cranial nerve deficit. He exhibits normal muscle tone.  Coordination normal.  Skin: Skin is warm and dry. No rash noted. He is not diaphoretic. No erythema. No pallor.  Psychiatric: He has a normal mood and affect. His behavior is normal. Judgment and thought content normal.          Assessment & Plan:  Well exam. We discussed diet and exercise. Get fasting labs.  Alysia Penna, MD

## 2017-08-08 ENCOUNTER — Other Ambulatory Visit: Payer: Self-pay

## 2017-08-08 ENCOUNTER — Telehealth: Payer: Self-pay | Admitting: Family Medicine

## 2017-08-08 MED ORDER — ATORVASTATIN CALCIUM 20 MG PO TABS: ORAL_TABLET | ORAL | 3 refills | Status: DC

## 2017-08-08 NOTE — Telephone Encounter (Signed)
Copied from [place] [id number]. Topic: Quick Communication - Lab Results >> Aug 07, 2017 12:16 PM Dorrene German, RN wrote: Hulen Skains patient to inform them of lab results. When patient returns call, triage nurse may disclose results.   Phone [phone]

## 2017-08-08 NOTE — Telephone Encounter (Signed)
Pt given lab results and documented in result note.  

## 2017-11-08 ENCOUNTER — Other Ambulatory Visit: Payer: Self-pay | Admitting: Family Medicine

## 2017-11-08 ENCOUNTER — Other Ambulatory Visit (INDEPENDENT_AMBULATORY_CARE_PROVIDER_SITE_OTHER): Payer: 59

## 2017-11-08 ENCOUNTER — Telehealth: Payer: Self-pay | Admitting: Family Medicine

## 2017-11-08 DIAGNOSIS — Z131 Encounter for screening for diabetes mellitus: Secondary | ICD-10-CM | POA: Diagnosis not present

## 2017-11-08 DIAGNOSIS — E782 Mixed hyperlipidemia: Secondary | ICD-10-CM

## 2017-11-08 LAB — LIPID PANEL
CHOL/HDL RATIO: 4
CHOLESTEROL: 159 mg/dL (ref 0–200)
HDL: 38.7 mg/dL — ABNORMAL LOW (ref >39.00)
NonHDL: 119.94
TRIGLYCERIDES: 290 mg/dL — AB (ref 0.0–149.0)
VLDL: 58 mg/dL — AB (ref 0.0–40.0)

## 2017-11-08 LAB — LDL CHOLESTEROL, DIRECT: Direct LDL: 89 mg/dL

## 2017-11-08 LAB — HEMOGLOBIN A1C: Hgb A1c MFr Bld: 6.1 % (ref 4.6–6.5)

## 2017-11-08 NOTE — Telephone Encounter (Signed)
Patient was here in office today for labs-rechecking lipid panel. Pt requested to have A1c done as well. I did get a verbal from Dr. Volanda Napoleon to order the additional test, diagnosis code used screening for diabetes. Pt said that he has had a elevated glucose test even when fasting. Dr. Sarajane Jews is out of the office today, will forward for review.

## 2017-11-14 ENCOUNTER — Telehealth: Payer: Self-pay | Admitting: Family Medicine

## 2017-11-14 NOTE — Telephone Encounter (Signed)
Pt given results per notes of Dr Sarajane Jews on 11/13/17.Unable to document in result note due to result note not being routed to [facility].  Please send [facility] to [facility] on file.

## 2017-11-15 ENCOUNTER — Other Ambulatory Visit: Payer: Self-pay

## 2017-11-15 MED ORDER — FENOFIBRATE 160 MG PO TABS: 160.0000 mg | ORAL_TABLET | Freq: Every day | ORAL | 3 refills | Status: DC

## 2017-11-15 NOTE — Telephone Encounter (Signed)
Fenofibrate 160 mg daily. Call in #[phone] with 3 rf

## 2018-02-22 ENCOUNTER — Ambulatory Visit: Payer: 59

## 2018-03-14 ENCOUNTER — Other Ambulatory Visit: Payer: Self-pay | Admitting: Family Medicine

## 2018-07-16 ENCOUNTER — Telehealth: Payer: Self-pay | Admitting: Family Medicine

## 2018-07-16 ENCOUNTER — Encounter: Payer: Self-pay | Admitting: Family Medicine

## 2018-07-16 ENCOUNTER — Ambulatory Visit (INDEPENDENT_AMBULATORY_CARE_PROVIDER_SITE_OTHER): Payer: BLUE CROSS/BLUE SHIELD | Admitting: Family Medicine

## 2018-07-16 ENCOUNTER — Other Ambulatory Visit: Payer: Self-pay

## 2018-07-16 DIAGNOSIS — M7542 Impingement syndrome of left shoulder: Secondary | ICD-10-CM

## 2018-07-16 DIAGNOSIS — M545 Low back pain: Secondary | ICD-10-CM | POA: Diagnosis not present

## 2018-07-16 MED ORDER — CYCLOBENZAPRINE HCL 10 MG PO TABS: 10.0000 mg | ORAL_TABLET | Freq: Three times a day (TID) | ORAL | 1 refills | Status: DC | PRN

## 2018-07-16 MED ORDER — METHYLPREDNISOLONE 4 MG PO TBPK: ORAL_TABLET | ORAL | 0 refills | Status: DC

## 2018-07-16 NOTE — Telephone Encounter (Signed)
Pain in shoulder started x4-5 days ago, radiating down to wrist - left side. No chest pain. throbbing pain down arm. No pain in neck. Pt recalls raking and working in the yard for about 10 mins, not sure if he pulled a muscle. Saw Teledoc this morning with [employer] Rx Pred 20mg  -- not taken yet.   Requesting Muscle relaxer.   [facility] [place]

## 2018-07-16 NOTE — Progress Notes (Signed)
Subjective:    Patient ID: Jonathan Howard, male    DOB: Mar 10, 1956, 63 y.o.   MRN: [id number]  HPI Virtual Visit via Video Note  I connected with the patient on 07/16/18 at  9:00 AM EDT by a video enabled telemedicine application and verified that I am speaking with the correct person using two identifiers.  Location patient: home Location provider:work or home office Persons participating in the virtual visit: patient, provider  I discussed the limitations of evaluation and management by telemedicine and the availability of in person appointments. The patient expressed understanding and agreed to proceed.   HPI: He reports a sharp pain in the front of the left shoulder that started 5 days ago and it now radiates down the left arm to the wrist. It can be severe at times. No neck or back pain. No numbness or weakness. No recent trauma. He has tried Mobic, Advil, and Tylenol and her has tried ice and heat, and these have given him some brief relief.    ROS: See pertinent positives and negatives per HPI.  Past Medical History:  Diagnosis Date  . Gout   . Hyperglycemia   . Hyperlipidemia   . Hypertension     Past Surgical History:  Procedure Laterality Date  . APPENDECTOMY    . COLONOSCOPY  11-20-09   per Dr. Deatra Ina, clear, repeat 10 yrs  . STRABISMUS SURGERY      Family History  Problem Relation Age of Onset  . Alcohol abuse Unknown   . Arthritis Unknown   . Coronary artery disease Unknown   . Diabetes Unknown   . Hypertension Unknown     SOCIAL HX:    Current Outpatient Medications:  .  atorvastatin (LIPITOR) 20 MG tablet, TAKE 1 TABLET (20 MG TOTAL) BY MOUTH DAILY., Disp: 90 tablet, Rfl: 3 .  atorvastatin (LIPITOR) 20 MG tablet, TAKE ONE TABLET BY MOUTH DAILY, Disp: 30 tablet, Rfl: 2 .  cyclobenzaprine (FLEXERIL) 10 MG tablet, Take 1 tablet (10 mg total) by mouth 3 (three) times daily as needed for muscle spasms., Disp: 60 tablet, Rfl: 1 .  fenofibrate 160 MG  tablet, Take 1 tablet (160 mg total) by mouth daily., Disp: 90 tablet, Rfl: 3 .  methylPREDNISolone (MEDROL DOSEPAK) 4 MG TBPK tablet, As directed, Disp: 21 tablet, Rfl: 0 .  triamcinolone cream (KENALOG) 0.1 %, Apply 1 application topically 2 (two) times daily., Disp: 45 g, Rfl: 2  EXAM:  VITALS per patient if applicable:  GENERAL: alert, oriented, appears well and in no acute distress  HEENT: atraumatic, conjunttiva clear, no obvious abnormalities on inspection of external nose and ears  NECK: normal movements of the head and neck  LUNGS: on inspection no signs of respiratory distress, breathing rate appears normal, no obvious gross SOB, gasping or wheezing  CV: no obvious cyanosis  MS: moves all visible extremities without noticeable abnormality  PSYCH/NEURO: pleasant and cooperative, no obvious depression or anxiety, speech and thought processing grossly intact  ASSESSMENT AND PLAN: He seems to have impingement in the shoulder. He will try Flexeril TID and he will start a Medrol dose pack. Recheck prn.  Alysia Penna, MD Discussed the following assessment and plan:  No diagnosis found.     I discussed the assessment and treatment plan with the patient. The patient was provided an opportunity to ask questions and all were answered. The patient agreed with the plan and demonstrated an understanding of the instructions.   The patient was advised  to call back or seek an in-person evaluation if the symptoms worsen or if the condition fails to improve as anticipated.  I provided 14 minutes of non-face-to-face time during this encounter.    Review of Systems     Objective:   Physical Exam        Assessment & Plan:

## 2018-07-16 NOTE — Telephone Encounter (Signed)
Pt scheduled for a 9am webex today with Dr Sarajane Jews.  Nothing further needed.

## 2018-08-03 ENCOUNTER — Ambulatory Visit (INDEPENDENT_AMBULATORY_CARE_PROVIDER_SITE_OTHER): Payer: BLUE CROSS/BLUE SHIELD | Admitting: Family Medicine

## 2018-08-03 ENCOUNTER — Encounter: Payer: Self-pay | Admitting: Family Medicine

## 2018-08-03 ENCOUNTER — Other Ambulatory Visit: Payer: Self-pay

## 2018-08-03 DIAGNOSIS — M109 Gout, unspecified: Secondary | ICD-10-CM | POA: Diagnosis not present

## 2018-08-03 DIAGNOSIS — L309 Dermatitis, unspecified: Secondary | ICD-10-CM | POA: Diagnosis not present

## 2018-08-03 MED ORDER — METHYLPREDNISOLONE 4 MG PO TBPK: ORAL_TABLET | ORAL | 0 refills | Status: DC

## 2018-08-03 MED ORDER — TRIAMCINOLONE ACETONIDE 0.1 % EX CREA: 1.0000 "application " | TOPICAL_CREAM | Freq: Two times a day (BID) | CUTANEOUS | 2 refills | Status: AC

## 2018-08-03 NOTE — Progress Notes (Signed)
Subjective:    Patient ID: Jonathan Howard, male    DOB: 04-09-1956, 63 y.o.   MRN: [id number]  HPI Virtual Visit via Video Note  I connected with the patient on 08/03/18 at  8:15 AM EDT by a video enabled telemedicine application and verified that I am speaking with the correct person using two identifiers.  Location patient: home Location provider:work or home office Persons participating in the virtual visit: patient, provider  I discussed the limitations of evaluation and management by telemedicine and the availability of in person appointments. The patient expressed understanding and agreed to proceed.   HPI: Here for 2 issues. First he has had an intermittent red itchy rash on the chest for the past 2 weeks. Also for 2 days he has had redness, swelling and pain at the base of the left great toe. He is certain this is a gout attack since he has had these a number of times.    ROS: See pertinent positives and negatives per HPI.  Past Medical History:  Diagnosis Date  . Gout   . Hyperglycemia   . Hyperlipidemia   . Hypertension     Past Surgical History:  Procedure Laterality Date  . APPENDECTOMY    . COLONOSCOPY  11-20-09   per Dr. Deatra Ina, clear, repeat 10 yrs  . STRABISMUS SURGERY      Family History  Problem Relation Age of Onset  . Alcohol abuse Unknown   . Arthritis Unknown   . Coronary artery disease Unknown   . Diabetes Unknown   . Hypertension Unknown      Current Outpatient Medications:  .  atorvastatin (LIPITOR) 20 MG tablet, TAKE 1 TABLET (20 MG TOTAL) BY MOUTH DAILY., Disp: 90 tablet, Rfl: 3 .  cyclobenzaprine (FLEXERIL) 10 MG tablet, Take 1 tablet (10 mg total) by mouth 3 (three) times daily as needed for muscle spasms., Disp: 60 tablet, Rfl: 1 .  fenofibrate 160 MG tablet, Take 1 tablet (160 mg total) by mouth daily., Disp: 90 tablet, Rfl: 3 .  triamcinolone cream (KENALOG) 0.1 %, Apply 1 application topically 2 (two) times daily., Disp: 45 g, Rfl: 2  .  atorvastatin (LIPITOR) 20 MG tablet, TAKE ONE TABLET BY MOUTH DAILY (Patient not taking: Reported on 08/03/2018), Disp: 30 tablet, Rfl: 2 .  methylPREDNISolone (MEDROL DOSEPAK) 4 MG TBPK tablet, As directed, Disp: 21 tablet, Rfl: 0  EXAM:  VITALS per patient if applicable:  GENERAL: alert, oriented, appears well and in no acute distress  HEENT: atraumatic, conjunttiva clear, no obvious abnormalities on inspection of external nose and ears  NECK: normal movements of the head and neck  LUNGS: on inspection no signs of respiratory distress, breathing rate appears normal, no obvious gross SOB, gasping or wheezing  CV: no obvious cyanosis  MS: moves all visible extremities without noticeable abnormality  PSYCH/NEURO: pleasant and cooperative, no obvious depression or anxiety, speech and thought processing grossly intact  ASSESSMENT AND PLAN: For the gout, try a Medrol dose pack. He also has eczema, so he can use Triamcinolone cream prn.  Alysia Penna, MD  Discussed the following assessment and plan:  No diagnosis found.     I discussed the assessment and treatment plan with the patient. The patient was provided an opportunity to ask questions and all were answered. The patient agreed with the plan and demonstrated an understanding of the instructions.   The patient was advised to call back or seek an in-person evaluation if the symptoms worsen  or if the condition fails to improve as anticipated.     Review of Systems     Objective:   Physical Exam        Assessment & Plan:

## 2018-09-10 ENCOUNTER — Other Ambulatory Visit: Payer: Self-pay | Admitting: Family Medicine

## 2018-11-26 ENCOUNTER — Telehealth: Payer: Self-pay | Admitting: Family Medicine

## 2018-11-26 NOTE — Telephone Encounter (Signed)
Please make a Doxy visit for this

## 2018-11-26 NOTE — Telephone Encounter (Signed)
Would you like for the patient to have a doxy visit?

## 2018-11-26 NOTE — Telephone Encounter (Signed)
Patient has been scheduled. Nothing further needed.  

## 2018-11-26 NOTE — Telephone Encounter (Signed)
See note

## 2018-11-26 NOTE — Telephone Encounter (Signed)
Pt stated he has had a persistent dry cough for the last 8 weeks or so. It is more prevalent when he lays down or gets in direct line of air. He believes it may have been caused due to fenofibrate 160 MG tablet .

## 2018-11-27 ENCOUNTER — Telehealth (INDEPENDENT_AMBULATORY_CARE_PROVIDER_SITE_OTHER): Payer: BC Managed Care – PPO | Admitting: Family Medicine

## 2018-11-27 ENCOUNTER — Encounter: Payer: Self-pay | Admitting: Family Medicine

## 2018-11-27 DIAGNOSIS — R05 Cough: Secondary | ICD-10-CM

## 2018-11-27 DIAGNOSIS — R053 Chronic cough: Secondary | ICD-10-CM

## 2018-11-27 NOTE — Progress Notes (Signed)
Virtual Visit via Video Note  I connected with the patient on 11/27/18 at  4:15 PM EDT by a video enabled telemedicine application and verified that I am speaking with the correct person using two identifiers.  Location patient: home Location provider:work or home office Persons participating in the virtual visit: patient, provider  I discussed the limitations of evaluation and management by telemedicine and the availability of in person appointments. The patient expressed understanding and agreed to proceed.   HPI: Here for 2 months of an intermittent dry cough with some wheezing. No chest pain or SOB or fever. He denies much in the way of heartburn. He does not take an ACE inhibitor. He has never smoked. No hx of asthma. He says the coughing will often start when he is talking or laughing.    ROS: See pertinent positives and negatives per HPI.  Past Medical History:  Diagnosis Date  . Gout   . Hyperglycemia   . Hyperlipidemia   . Hypertension     Past Surgical History:  Procedure Laterality Date  . APPENDECTOMY    . COLONOSCOPY  11-20-09   per Dr. Deatra Ina, clear, repeat 10 yrs  . STRABISMUS SURGERY      Family History  Problem Relation Age of Onset  . Alcohol abuse Unknown   . Arthritis Unknown   . Coronary artery disease Unknown   . Diabetes Unknown   . Hypertension Unknown      Current Outpatient Medications:  .  atorvastatin (LIPITOR) 20 MG tablet, TAKE 1 TABLET (20 MG TOTAL) BY MOUTH DAILY., Disp: 90 tablet, Rfl: 3 .  atorvastatin (LIPITOR) 20 MG tablet, TAKE ONE TABLET BY MOUTH DAILY (Patient not taking: Reported on 08/03/2018), Disp: 30 tablet, Rfl: 2 .  atorvastatin (LIPITOR) 20 MG tablet, TAKE 1 TABLET BY MOUTH EVERY DAY, Disp: 90 tablet, Rfl: 3 .  cyclobenzaprine (FLEXERIL) 10 MG tablet, Take 1 tablet (10 mg total) by mouth 3 (three) times daily as needed for muscle spasms., Disp: 60 tablet, Rfl: 1 .  fenofibrate 160 MG tablet, Take 1 tablet (160 mg total) by  mouth daily., Disp: 90 tablet, Rfl: 3 .  methylPREDNISolone (MEDROL DOSEPAK) 4 MG TBPK tablet, As directed, Disp: 21 tablet, Rfl: 0 .  triamcinolone cream (KENALOG) 0.1 %, Apply 1 application topically 2 (two) times daily., Disp: 45 g, Rfl: 2  EXAM:  VITALS per patient if applicable:  GENERAL: alert, oriented, appears well and in no acute distress  HEENT: atraumatic, conjunttiva clear, no obvious abnormalities on inspection of external nose and ears  NECK: normal movements of the head and neck  LUNGS: on inspection no signs of respiratory distress, breathing rate appears normal, no obvious gross SOB, gasping or wheezing  CV: no obvious cyanosis  MS: moves all visible extremities without noticeable abnormality  PSYCH/NEURO: pleasant and cooperative, no obvious depression or anxiety, speech and thought processing grossly intact  ASSESSMENT AND PLAN: Chronic cough that is likely due to silent GERD. He will take Prilosec OTC every night one hour before bedtime for the next 3 weeks. He will schedule for a complete physical in 3 weeks, and we will re-evaluate the cough at that time.  Alysia Penna, MD  Discussed the following assessment and plan:  No diagnosis found.     I discussed the assessment and treatment plan with the patient. The patient was provided an opportunity to ask questions and all were answered. The patient agreed with the plan and demonstrated an understanding of the instructions.  The patient was advised to call back or seek an in-person evaluation if the symptoms worsen or if the condition fails to improve as anticipated.

## 2018-11-28 ENCOUNTER — Telehealth: Payer: Self-pay | Admitting: Family Medicine

## 2018-11-28 NOTE — Telephone Encounter (Signed)
Copied from [place] [id number]. Topic: Appointment Scheduling - Scheduling Inquiry for Clinic >> Nov 27, 2018  4:43 PM Erick Blinks wrote: Reason for CRM: Pt called and has yet to receive link for appt. Please advise

## 2018-12-12 ENCOUNTER — Ambulatory Visit (INDEPENDENT_AMBULATORY_CARE_PROVIDER_SITE_OTHER): Payer: BC Managed Care – PPO

## 2018-12-12 ENCOUNTER — Ambulatory Visit: Payer: BC Managed Care – PPO | Admitting: Internal Medicine

## 2018-12-12 ENCOUNTER — Encounter: Payer: Self-pay | Admitting: Internal Medicine

## 2018-12-12 ENCOUNTER — Other Ambulatory Visit: Payer: Self-pay

## 2018-12-12 DIAGNOSIS — R05 Cough: Secondary | ICD-10-CM | POA: Diagnosis not present

## 2018-12-12 DIAGNOSIS — Z23 Encounter for immunization: Secondary | ICD-10-CM

## 2018-12-12 DIAGNOSIS — R058 Other specified cough: Secondary | ICD-10-CM | POA: Insufficient documentation

## 2018-12-12 LAB — CBC WITH DIFFERENTIAL/PLATELET
Basophils Absolute: 0 10*3/uL (ref 0.0–0.1)
Basophils Relative: 0.4 % (ref 0.0–3.0)
Eosinophils Absolute: 0.2 10*3/uL (ref 0.0–0.7)
Eosinophils Relative: 3.1 % (ref 0.0–5.0)
HCT: 48.7 % (ref 39.0–52.0)
Hemoglobin: 16.5 g/dL (ref 13.0–17.0)
Lymphocytes Relative: 37.2 % (ref 12.0–46.0)
Lymphs Abs: 2.5 10*3/uL (ref 0.7–4.0)
MCHC: 33.8 g/dL (ref 30.0–36.0)
MCV: 92.8 fl (ref 78.0–100.0)
Monocytes Absolute: 0.6 10*3/uL (ref 0.1–1.0)
Monocytes Relative: 9 % (ref 3.0–12.0)
Neutro Abs: 3.3 10*3/uL (ref 1.4–7.7)
Neutrophils Relative %: 50.3 % (ref 43.0–77.0)
Platelets: 243 10*3/uL (ref 150.0–400.0)
RBC: 5.25 Mil/uL (ref 4.22–5.81)
RDW: 12.5 % (ref 11.5–15.5)
WBC: 6.6 10*3/uL (ref 4.0–10.5)

## 2018-12-12 MED ORDER — METHYLPREDNISOLONE ACETATE 80 MG/ML IJ SUSP
120.0000 mg | Freq: Once | INTRAMUSCULAR | Status: AC
  Administered 2018-12-12: 120 mg via INTRAMUSCULAR

## 2018-12-12 MED ORDER — PANTOPRAZOLE SODIUM 40 MG PO TBEC: DELAYED_RELEASE_TABLET | ORAL | 2 refills | Status: DC

## 2018-12-12 MED ORDER — BENZONATATE 200 MG PO CAPS: 200.0000 mg | ORAL_CAPSULE | Freq: Three times a day (TID) | ORAL | 1 refills | Status: DC | PRN

## 2018-12-12 NOTE — Progress Notes (Signed)
Jonathan Howard, male    DOB: 11/24/1955     MRN: [id number]   Brief patient profile:  56  yowm never smoker never resp or allergy issues indolent onset persistent daily  cough May 2020 eval by [place] by Dr Sarajane Jews rx prilosec may have helped some but never cleared it so referred to pulmonary clinic 12/12/2018     History of Present Illness  12/12/2018  Pulmonary/ 1st office eval/Gloriana Piltz  Chief Complaint  Patient presents with  . Pulmonary Consult    Self referral. Pt c/o cough x 3 months- non prod and worse when he first lies down and when air blows on him. Laughing also can trigger cough.   Dyspnea:  Not limited by breathing from desired activities  / weed eating ok  Cough: mostly dry mostly daytime and notes right at hs then goes away and sleeps fine  Sleep: on side  SABA use: none    Kouffman Reflux v Neurogenic Cough Differentiator Reflux Comments  Do you awaken from a sound sleep coughing violently?                            With trouble breathing? No no   Do you have choking episodes when you cannot  Get enough air, gasping for air ?               a few times    Do you usually cough when you lie down into  The bed, or when you just lie down to rest ?                          Yes   Do you usually cough after meals or eating?         occ  No pattern   Do you cough when (or after) you bend over?    No    GERD SCORE     Kouffman Reflux v Neurogenic Cough Differentiator Neurogenic   Do you more-or-less cough all day long? Very sporadic    Does change of temperature make you cough? A/c does it   Does laughing or chuckling cause you to cough? Yes/worst   Do fumes (perfume, automobile fumes, burned  Toast, etc.,) cause you to cough ?      no   Does speaking, singing, or talking on the phone cause you to cough   ?               Maybe talking    Neurogenic/Airway score     No obvious day to day or daytime variability or assoc excess/ purulent sputum or mucus plugs or hemoptysis or cp  or chest tightness, subjective wheeze or overt sinus or hb symptoms.   Sleeping (once asleep)  without nocturnal  or early am exacerbation  of respiratory  c/o's or need for noct saba. Also denies any obvious fluctuation of symptoms with weather or environmental changes or other aggravating or alleviating factors except as outlined above   No unusual exposure hx or h/o childhood pna/ asthma or knowledge of premature birth.  Current Allergies, Complete Past Medical History, Past Surgical History, Family History, and Social History were reviewed in [employer] record.  ROS  The following are not active complaints unless bolded Hoarseness, sore throat, dysphagia, dental problems, itching, sneezing,  nasal congestion or discharge of excess mucus or purulent secretions, ear ache,  fever, chills, sweats, unintended wt loss or wt gain, classically pleuritic or exertional cp,  orthopnea pnd or arm/hand swelling  or leg swelling, presyncope, palpitations, abdominal pain, anorexia, nausea, vomiting, diarrhea  or change in bowel habits or change in bladder habits, change in stools or change in urine, dysuria, hematuria,  rash, arthralgias(gout) , visual complaints, headache, numbness, weakness or ataxia or problems with walking or coordination,  change in mood or  memory.           Past Medical History:  Diagnosis Date  . Gout   . Hyperglycemia   . Hyperlipidemia   . Hypertension     Outpatient Medications Prior to Visit  Medication Sig Dispense Refill  . omeprazole (PRILOSEC) 20 MG capsule Take 20 mg by mouth daily.    Marland Kitchen atorvastatin (LIPITOR) 20 MG tablet TAKE 1 TABLET (20 MG TOTAL) BY MOUTH DAILY. 90 tablet 3  . atorvastatin (LIPITOR) 20 MG tablet TAKE 1 TABLET BY MOUTH EVERY DAY 90 tablet 3  . cyclobenzaprine (FLEXERIL) 10 MG tablet Take 1 tablet (10 mg total) by mouth 3 (three) times daily as needed for muscle spasms. 60 tablet 1  . fenofibrate 160 MG tablet Take 1 tablet  (160 mg total) by mouth daily. 90 tablet 3  . triamcinolone cream (KENALOG) 0.1 % Apply 1 application topically 2 (two) times daily. 45 g 2  . atorvastatin (LIPITOR) 20 MG tablet TAKE ONE TABLET BY MOUTH DAILY (Patient not taking: Reported on 08/03/2018) 30 tablet 2  . methylPREDNISolone (MEDROL DOSEPAK) 4 MG TBPK tablet As directed 21 tablet 0      Objective:     BP (!) 154/80 (BP Location: Left Arm, Cuff Size: Normal)   Pulse 66   Temp (!) 97.1 F (36.2 C) (Oral)   Ht 5\' 11"  (1.803 m)   Wt 222 lb 12.8 oz (101.1 kg)   SpO2 99% Comment: on RA  BMI 31.07 kg/m   SpO2: 99 %(on RA)   Very pleasant mod obese wm nad  HEENT: nl dentition, turbinates bilaterally, and oropharynx. Nl external ear canals without cough reflex   NECK :  without JVD/Nodes/TM/ nl carotid upstrokes bilaterally   LUNGS: no acc muscle use,  Nl contour chest   with cough exp maneuvers but minimal exp rhonchi    CV:  RRR  no s3 or murmur or increase in P2, and no edema   ABD:  soft and nontender with nl inspiratory excursion in the supine position. No bruits or organomegaly appreciated, bowel sounds nl  MS:  Nl gait/ ext warm without deformities, calf tenderness, cyanosis or clubbing No obvious joint restrictions   SKIN: warm and dry without lesions    NEURO:  alert, approp, nl sensorium with  no motor or cerebellar deficits apparent.       CXR PA and Lateral:   12/12/2018 :    I personally reviewed images and agree with radiology impression as follows:   No acute abnormality of the lungs.    Labs ordered 12/12/2018  :  allergy profile       Assessment   Upper airway cough syndrome vs cough variant asthma Onset May 2020 with cat exposurec x 2017 ? Partial improvement from otc ppi - Allergy profile 12/12/2018 >  Eos 0.2 /  IgE  Pending  - 12/12/2018 max rx for gerd/ depomedrol 120 mg IM x one and regroup in 2 weeks on prn tessalon 200   The most common causes of chronic cough in  immunocompetent  adults include the following: upper airway cough syndrome (UACS), previously referred to as postnasal drip syndrome (PNDS), which is caused by variety of rhinosinus conditions; (2) asthma; (3) GERD; (4) chronic bronchitis from cigarette smoking or other inhaled environmental irritants; (5) nonasthmatic eosinophilic bronchitis; and (6) bronchiectasis.   These conditions, singly or in combination, have accounted for up to 94% of the causes of chronic cough in prospective studies.   Other conditions have constituted no >6% of the causes in prospective studies These have included bronchogenic carcinoma, chronic interstitial pneumonia, sarcoidosis, left ventricular failure, ACEI-induced cough, and aspiration from a condition associated with pharyngeal dysfunction.    Chronic cough is often simultaneously caused by more than one condition. A single cause has been found from 38 to 82% of the time, multiple causes from 18 to 62%. Multiply caused cough has been the result of three diseases up to 42% of the time.       The cough on exp is typical of asthma but he has no atopic hx previously so more than likely the asthma if present if being driven by a different mech  Of the three most common causes of  Sub-acute / recurrent or chronic cough, only one (GERD)  can actually contribute to/ trigger  the other two (asthma and post nasal drip syndrome)  and perpetuate the cylce of cough.  While not intuitively obvious, many patients with chronic low grade reflux do not cough until there is a primary insult that disturbs the protective epithelial barrier and exposes sensitive nerve endings.   This is typically viral but can due to PNDS and  either may apply here.   \ \>>> The point is that once this occurs, it is difficult to eliminate the cycle  using anything but a maximally effective acid suppression regimen at least in the short run, accompanied by an appropriate diet to address non acid GERD and control /  eliminate the cough itself if possible initially with with [place] non-narcotic cough suppression and one dose depomedrol 120  Mg IM  in case of component of Th-2 driven upper or lower airways inflammation (if cough responds short term only to relapse before return while on rx for uacs that would point to allergic rhinitis/ asthma or eos bronchitis)      advised The standardized cough guidelines published in Chest by Lissa Morales in 2006 are still the best available and consist of a multiple step process (up to 12!) , not a single office visit,  and are intended  to address this problem logically,  with an alogrithm dependent on response to empiric treatment at  each progressive step  to determine a specific diagnosis with  minimal addtional testing needed. Therefore if adherence is an issue or can't be accurately verified,  it's very unlikely the standard evaluation and treatment will be successful here.    Furthermore, response to therapy (other than acute cough suppression, which should only be used short term with avoidance of narcotic containing cough syrups if possible), can be a gradual process for which the patient is not likely to  perceive immediate benefit.  Unlike going to an eye doctor where the best perscription is almost always the first one and is immediately effective, this is almost never the case in the management of chronic cough syndromes. Therefore the patient needs to commit up front to consistently adhere to recommendations  for up to 6 weeks of therapy directed at the likely underlying problem(s) before the response  can be reasonably evaluated.     >>> f/u in 2 weeks   Total time devoted to counseling  > 50 % of initial 60 min office visit:  review case with pt/ discussion of options/alternatives/ personally creating written customized instructions  in presence of pt  then going over those specific  Instructions directly with the pt including how to use all of the meds but in  particular covering each new medication in detail and the difference between the maintenance= "automatic" meds and the prns using an action plan format for the latter (If this problem/symptom => do that organization reading Left to right).  Please see AVS from this visit for a full list of these instructions which I personally wrote for this pt and  are unique to this visit.      Christinia Gully, MD 12/12/2018

## 2018-12-12 NOTE — Patient Instructions (Signed)
For cough / urge to cough > tessalon 200 mg every 6 hours as needed  Depomedrol 120 mg IM    Protonix 40 mg (prilosec 20 x 2)  Take 30- 60 min before your first and last meals of the day    GERD (REFLUX)  is an extremely common cause of respiratory symptoms just like yours , many times with no obvious heartburn at all.    It can be treated with medication, but also with lifestyle changes including elevation of the head of your bed (ideally with 6 -8inch blocks under the headboard of your bed),  Smoking cessation, avoidance of late meals, excessive alcohol, and avoid fatty foods, chocolate, peppermint, colas, red wine, and acidic juices such as orange juice.  NO MINT OR MENTHOL PRODUCTS SO NO COUGH DROPS  USE SUGARLESS CANDY INSTEAD (Jolley ranchers or Stover's or Life Savers) or even ice chips will also do - the key is to swallow to prevent all throat clearing. NO OIL BASED VITAMINS - use powdered substitutes.  Avoid fish oil when coughing.   Please remember to go to the lab and x-ray department   for your tests - we will call you with the results when they are available.      Please schedule a follow up office visit in 2  weeks, sooner if needed

## 2018-12-12 NOTE — Progress Notes (Signed)
Spoke with pt and notified of results per Dr. Wert. Pt verbalized understanding and denied any questions. 

## 2018-12-12 NOTE — Assessment & Plan Note (Addendum)
Onset May 2020 with cat exposurec x 2017 ? Partial improvement from otc ppi - Allergy profile 12/12/2018 >  Eos 0.2 /  IgE  Pending  - 12/12/2018 max rx for gerd/ depomedrol 120 mg IM x one and regroup in 2 weeks on prn tessalon 200   The most common causes of chronic cough in immunocompetent adults include the following: upper airway cough syndrome (UACS), previously referred to as postnasal drip syndrome (PNDS), which is caused by variety of rhinosinus conditions; (2) asthma; (3) GERD; (4) chronic bronchitis from cigarette smoking or other inhaled environmental irritants; (5) nonasthmatic eosinophilic bronchitis; and (6) bronchiectasis.   These conditions, singly or in combination, have accounted for up to 94% of the causes of chronic cough in prospective studies.   Other conditions have constituted no >6% of the causes in prospective studies These have included bronchogenic carcinoma, chronic interstitial pneumonia, sarcoidosis, left ventricular failure, ACEI-induced cough, and aspiration from a condition associated with pharyngeal dysfunction.    Chronic cough is often simultaneously caused by more than one condition. A single cause has been found from 38 to 82% of the time, multiple causes from 18 to 62%. Multiply caused cough has been the result of three diseases up to 42% of the time.       The cough on exp is typical of asthma but he has no atopic hx previously so more than likely the asthma if present if being driven by a different mech  Of the three most common causes of  Sub-acute / recurrent or chronic cough, only one (GERD)  can actually contribute to/ trigger  the other two (asthma and post nasal drip syndrome)  and perpetuate the cylce of cough.  While not intuitively obvious, many patients with chronic low grade reflux do not cough until there is a primary insult that disturbs the protective epithelial barrier and exposes sensitive nerve endings.   This is typically viral but can due  to PNDS and  either may apply here.   \ \>>> The point is that once this occurs, it is difficult to eliminate the cycle  using anything but a maximally effective acid suppression regimen at least in the short run, accompanied by an appropriate diet to address non acid GERD and control / eliminate the cough itself if possible initially with with [place] non-narcotic cough suppression and one dose depomedrol 120  Mg IM  in case of component of Th-2 driven upper or lower airways inflammation (if cough responds short term only to relapse before return while on rx for uacs that would point to allergic rhinitis/ asthma or eos bronchitis)      advised The standardized cough guidelines published in Chest by Lissa Morales in 2006 are still the best available and consist of a multiple step process (up to 12!) , not a single office visit,  and are intended  to address this problem logically,  with an alogrithm dependent on response to empiric treatment at  each progressive step  to determine a specific diagnosis with  minimal addtional testing needed. Therefore if adherence is an issue or can't be accurately verified,  it's very unlikely the standard evaluation and treatment will be successful here.    Furthermore, response to therapy (other than acute cough suppression, which should only be used short term with avoidance of narcotic containing cough syrups if possible), can be a gradual process for which the patient is not likely to  perceive immediate benefit.  Unlike going to an eye  doctor where the best perscription is almost always the first one and is immediately effective, this is almost never the case in the management of chronic cough syndromes. Therefore the patient needs to commit up front to consistently adhere to recommendations  for up to 6 weeks of therapy directed at the likely underlying problem(s) before the response can be reasonably evaluated.     >>> f/u in 2 weeks   Total time devoted to  counseling  > 50 % of initial 60 min office visit:  review case with pt/ discussion of options/alternatives/ personally creating written customized instructions  in presence of pt  then going over those specific  Instructions directly with the pt including how to use all of the meds but in particular covering each new medication in detail and the difference between the maintenance= "automatic" meds and the prns using an action plan format for the latter (If this problem/symptom => do that organization reading Left to right).  Please see AVS from this visit for a full list of these instructions which I personally wrote for this pt and  are unique to this visit.

## 2018-12-13 LAB — RESPIRATORY ALLERGY PROFILE REGION II ~~LOC~~
Allergen, A. alternata, m6: <0.1 kU/L
Allergen, Cedar tree, t12: 0.16 kU/L — ABNORMAL HIGH
Allergen, Comm Silver Birch, t9: <0.1 kU/L
Allergen, Cottonwood, t14: <0.1 kU/L
Allergen, D pternoyssinus,d7: 0.32 kU/L — ABNORMAL HIGH
Allergen, Mouse Urine Protein, e78: <0.1 kU/L
Allergen, Mulberry, t76: <0.1 kU/L
Allergen, Oak,t7: 0.12 kU/L — ABNORMAL HIGH
Allergen, P. notatum, m1: <0.1 kU/L
Aspergillus fumigatus, m3: <0.1 kU/L
Bermuda Grass: <0.1 kU/L
Box Elder IgE: <0.1 kU/L
CLADOSPORIUM HERBARUM (M2) IGE: 0.1 kU/L — ABNORMAL HIGH
COMMON RAGWEED (SHORT) (W1) IGE: <0.1 kU/L
Cat Dander: 0.11 kU/L — ABNORMAL HIGH
Class: 0
Class: 0
Class: 0
Class: 0
Class: 0
Class: 0
Class: 0
Class: 0
Class: 0
Class: 0
Class: 0
Class: 0
Class: 0
Class: 0
Class: 0
Class: 0
Class: 0
Class: 0
Class: 0
Class: 0
Class: 0
Class: 0
Class: 0
Class: 2
Cockroach: 0.75 kU/L — ABNORMAL HIGH
D. farinae: 0.29 kU/L — ABNORMAL HIGH
Dog Dander: 0.14 kU/L — ABNORMAL HIGH
Elm IgE: 0.18 kU/L — ABNORMAL HIGH
IgE (Immunoglobulin E), Serum: 220 kU/L — ABNORMAL HIGH (ref <=114)
Johnson Grass: 0.33 kU/L — ABNORMAL HIGH
Pecan/Hickory Tree IgE: <0.1 kU/L
Rough Pigweed  IgE: <0.1 kU/L
Sheep Sorrel IgE: <0.1 kU/L
Timothy Grass: <0.1 kU/L

## 2018-12-13 LAB — INTERPRETATION:

## 2018-12-14 NOTE — Progress Notes (Signed)
Spoke with pt and notified of results per Dr. Wert. Pt verbalized understanding and denied any questions. 

## 2018-12-26 ENCOUNTER — Ambulatory Visit (INDEPENDENT_AMBULATORY_CARE_PROVIDER_SITE_OTHER): Payer: BC Managed Care – PPO | Admitting: Adult Health

## 2018-12-26 ENCOUNTER — Other Ambulatory Visit: Payer: Self-pay

## 2018-12-26 ENCOUNTER — Encounter: Payer: Self-pay | Admitting: Adult Health

## 2018-12-26 ENCOUNTER — Ambulatory Visit: Payer: BC Managed Care – PPO | Admitting: Internal Medicine

## 2018-12-26 VITALS — BP 124/68 | HR 51 | Temp 97.3°F | Ht 71.0 in | Wt 219.8 lb

## 2018-12-26 DIAGNOSIS — R05 Cough: Secondary | ICD-10-CM

## 2018-12-26 DIAGNOSIS — R058 Other specified cough: Secondary | ICD-10-CM

## 2018-12-26 NOTE — Patient Instructions (Signed)
Add Zyrtec 10mg  At bedtime   Tessalon  Three times a day  As needed  Cough  Continue on Protonix 40mg  daily before meal .  Albuterol Inhaler 2 puffs every 4hr as needed for wheezing .  Follow up with Dr. Melvyn Novas  In 2 months and As needed  With PFT  Please contact office for sooner follow up if symptoms do not improve or worsen or seek emergency care

## 2018-12-26 NOTE — Progress Notes (Signed)
@Patient  ID: Jonathan Howard, male    DOB: April 12, 1956, 63 y.o.   MRN: [id number]  Chief Complaint  Patient presents with   Follow-up    Cough    Referring provider: Laurey Morale, MD  HPI: 63 year old male never smoker seen for pulmonary consult December 12, 2018 chronic cough.  That began in May 2020.  TEST/EVENTS :  Allergy profile 12/12/2018 >  Eos 0.2 /  IgE  Pending   12/26/2018 Follow up : Cough  Patient returns for a follow-up visit.  Patient was seen last on December 12, 2018 for a pulmonary consult for chronic cough that began in May 2020. Chest x-ray last visit showed clear lungs.  Allergy profile showed elevated IgE at 220.  Positive to grass, trees, dog dander, cat dander, dust. He is being treated with cough control regimen with tessalon and GERD prevention with PPI . He was given a Depo Medrol injection last visit . Says Cough is better . Not gone but much better . Still has cough and occasional wheeze with deep breath.    Patient works in [employer].  He does have a dog and a cat.  He is a never smoker.  He does not have a basement or hot tub.  No travel or unusual hobbies.    No Known Allergies  Immunization History  Administered Date(s) Administered   Fluad Quad(high Dose 65+) 12/12/2018   Hepatitis B 06/30/2006, 09/25/2006, 02/19/2007   Influenza Split 02/02/2012   Influenza,inj,Quad PF,6+ Mos 01/27/2018, 12/12/2018   Influenza-Unspecified 05/02/2016    Past Medical History:  Diagnosis Date   Gout    Hyperglycemia    Hyperlipidemia    Hypertension     Tobacco History: Social History   Tobacco Use  Smoking Status Never Smoker  Smokeless Tobacco Never Used   Counseling given: Not Answered   Outpatient Medications Prior to Visit  Medication Sig Dispense Refill   atorvastatin (LIPITOR) 20 MG tablet TAKE 1 TABLET BY MOUTH EVERY DAY 90 tablet 3   benzonatate (TESSALON) 200 MG capsule Take 1 capsule (200 mg total) by mouth 3 (three) times daily  as needed for cough. 40 capsule 1   cyclobenzaprine (FLEXERIL) 10 MG tablet Take 1 tablet (10 mg total) by mouth 3 (three) times daily as needed for muscle spasms. 60 tablet 1   fenofibrate 160 MG tablet Take 1 tablet (160 mg total) by mouth daily. 90 tablet 3   pantoprazole (PROTONIX) 40 MG tablet Take 30- 60 min before your first and last meals of the day 60 tablet 2   triamcinolone cream (KENALOG) 0.1 % Apply 1 application topically 2 (two) times daily. 45 g 2   No facility-administered medications prior to visit.      Review of Systems:   Constitutional:   No  weight loss, night sweats,  Fevers, chills, fatigue, or  lassitude.  HEENT:   No headaches,  Difficulty swallowing,  Tooth/dental problems, or  Sore throat,                No sneezing, itching, ear ache, +mild nasal congestion, post nasal drip,   CV:  No chest pain,  Orthopnea, PND, swelling in lower extremities, anasarca, dizziness, palpitations, syncope.   GI  No heartburn, indigestion, abdominal pain, nausea, vomiting, diarrhea, change in bowel habits, loss of appetite, bloody stools.   Resp: .  No chest wall deformity  Skin: no rash or lesions.  GU: no dysuria, change in color of urine, no urgency  or frequency.  No flank pain, no hematuria   MS:  No joint pain or swelling.  No decreased range of motion.  No back pain.    Physical Exam  BP 124/68 (BP Location: Left Arm, Cuff Size: Normal)    Pulse (!) 51    Temp (!) 97.3 F (36.3 C) (Temporal)    Ht 5\' 11"  (1.803 m)    Wt 219 lb 12.8 oz (99.7 kg)    SpO2 97%    BMI 30.66 kg/m   GEN: A/Ox3; pleasant , NAD, well nourished    HEENT:  [place]/AT,  , NOSE-clear, THROAT-clear, no lesions, no postnasal drip or exudate noted.   NECK:  Supple w/ fair ROM; no JVD; normal carotid impulses w/o bruits; no thyromegaly or nodules palpated; no lymphadenopathy.    RESP  Clear  P & A; w/o, wheezes/ rales/ or rhonchi. no accessory muscle use, no dullness to percussion  CARD:   RRR, no m/r/g, no peripheral edema, pulses intact, no cyanosis or clubbing.  GI:   Soft & nt; nml bowel sounds; no organomegaly or masses detected.   Musco: Warm bil, no deformities or joint swelling noted.   Neuro: alert, no focal deficits noted.    Skin: Warm, no lesions or rashes    Lab Results:  CBC    Component Value Date/Time   WBC 6.6 12/12/2018 1151   RBC 5.25 12/12/2018 1151   HGB 16.5 12/12/2018 1151   HCT 48.7 12/12/2018 1151   PLT 243.0 12/12/2018 1151   MCV 92.8 12/12/2018 1151   MCHC 33.8 12/12/2018 1151   RDW 12.5 12/12/2018 1151   LYMPHSABS 2.5 12/12/2018 1151   MONOABS 0.6 12/12/2018 1151   EOSABS 0.2 12/12/2018 1151   BASOSABS 0.0 12/12/2018 1151    BMET    Component Value Date/Time   NA 140 08/02/2017 0900   K 3.9 08/02/2017 0900   CL 103 08/02/2017 0900   CO2 28 08/02/2017 0900   GLUCOSE 108 (H) 08/02/2017 0900   BUN 14 08/02/2017 0900   CREATININE 1.07 08/02/2017 0900   CALCIUM 9.0 08/02/2017 0900   GFRNONAA >90 10/27/2013 0941   GFRAA >90 10/27/2013 0941    BNP No results found for: BNP  ProBNP No results found for: PROBNP  Imaging: Dg Chest 2 View  Result Date: 12/12/2018 CLINICAL DATA:  Cough EXAM: CHEST - 2 VIEW COMPARISON:  None. FINDINGS: The heart size and mediastinal contours are within normal limits. Both lungs are clear. The visualized skeletal structures are unremarkable. IMPRESSION: No acute abnormality of the lungs. Electronically Signed   By: Eddie Candle M.D.   On: 12/12/2018 16:19    methylPREDNISolone acetate (DEPO-MEDROL) injection 120 mg    Date Action Dose Route User   12/12/2018 1204 Given 120 mg Intramuscular (Left Upper Outer Quadrant) Rosana Berger, CMA      No flowsheet data found.  No results found for: NITRICOXIDE      Assessment & Plan:   No problem-specific Assessment & Plan notes found for this encounter.     Rexene Edison, NP 12/26/2018

## 2018-12-27 NOTE — Assessment & Plan Note (Signed)
Upper airway cough syndrome versus cough variant asthma.  Patient is significantly improved with regimen aimed at treatment of cough suppression, GERD prevention.  Labs do indicate an allergic process with an elevated IgE and a few mildly positive allergens on allergy profile.  Discussed trigger prevention and adding Zyrtec daily. Check PFTs on return.  Plan  Patient Instructions  Add Zyrtec 10mg  At bedtime   Tessalon  Three times a day  As needed  Cough  Continue on Protonix 40mg  daily before meal .  Albuterol Inhaler 2 puffs every 4hr as needed for wheezing .  Follow up with Dr. Melvyn Novas  In 2 months and As needed  With PFT  Please contact office for sooner follow up if symptoms do not improve or worsen or seek emergency care

## 2019-01-27 ENCOUNTER — Other Ambulatory Visit: Payer: Self-pay | Admitting: Family Medicine

## 2019-03-04 ENCOUNTER — Other Ambulatory Visit: Payer: Self-pay | Admitting: Internal Medicine

## 2019-03-04 DIAGNOSIS — R058 Other specified cough: Secondary | ICD-10-CM

## 2019-03-04 DIAGNOSIS — R05 Cough: Secondary | ICD-10-CM

## 2019-03-22 ENCOUNTER — Encounter: Payer: Self-pay | Admitting: Family Medicine

## 2019-03-22 ENCOUNTER — Ambulatory Visit (INDEPENDENT_AMBULATORY_CARE_PROVIDER_SITE_OTHER): Payer: BC Managed Care – PPO | Admitting: Family Medicine

## 2019-03-22 ENCOUNTER — Other Ambulatory Visit: Payer: Self-pay

## 2019-03-22 VITALS — BP 158/80 | HR 56 | Temp 98.0°F | Ht 71.0 in | Wt 218.0 lb

## 2019-03-22 DIAGNOSIS — Z0000 Encounter for general adult medical examination without abnormal findings: Secondary | ICD-10-CM

## 2019-03-22 DIAGNOSIS — Z23 Encounter for immunization: Secondary | ICD-10-CM | POA: Diagnosis not present

## 2019-03-22 DIAGNOSIS — R739 Hyperglycemia, unspecified: Secondary | ICD-10-CM

## 2019-03-22 DIAGNOSIS — Z125 Encounter for screening for malignant neoplasm of prostate: Secondary | ICD-10-CM | POA: Diagnosis not present

## 2019-03-22 LAB — CBC WITH DIFFERENTIAL/PLATELET
Basophils Absolute: 0 10*3/uL (ref 0.0–0.1)
Basophils Relative: 0.6 % (ref 0.0–3.0)
Eosinophils Absolute: 0.2 10*3/uL (ref 0.0–0.7)
Eosinophils Relative: 3.3 % (ref 0.0–5.0)
HCT: 47.8 % (ref 39.0–52.0)
Hemoglobin: 16.3 g/dL (ref 13.0–17.0)
Lymphocytes Relative: 41.1 % (ref 12.0–46.0)
Lymphs Abs: 2.5 10*3/uL (ref 0.7–4.0)
MCHC: 34 g/dL (ref 30.0–36.0)
MCV: 92.4 fl (ref 78.0–100.0)
Monocytes Absolute: 0.6 10*3/uL (ref 0.1–1.0)
Monocytes Relative: 9.1 % (ref 3.0–12.0)
Neutro Abs: 2.8 10*3/uL (ref 1.4–7.7)
Neutrophils Relative %: 45.9 % (ref 43.0–77.0)
Platelets: 258 10*3/uL (ref 150.0–400.0)
RBC: 5.18 Mil/uL (ref 4.22–5.81)
RDW: 13.1 % (ref 11.5–15.5)
WBC: 6.2 10*3/uL (ref 4.0–10.5)

## 2019-03-22 LAB — HEMOGLOBIN A1C: Hgb A1c MFr Bld: 6.1 % (ref 4.6–6.5)

## 2019-03-22 LAB — LDL CHOLESTEROL, DIRECT: Direct LDL: 107 mg/dL

## 2019-03-22 LAB — HEPATIC FUNCTION PANEL
ALT: 70 U/L — ABNORMAL HIGH (ref 0–53)
AST: 53 U/L — ABNORMAL HIGH (ref 0–37)
Albumin: 4.8 g/dL (ref 3.5–5.2)
Alkaline Phosphatase: 38 U/L — ABNORMAL LOW (ref 39–117)
Bilirubin, Direct: 0.2 mg/dL (ref 0.0–0.3)
Total Bilirubin: 0.7 mg/dL (ref 0.2–1.2)
Total Protein: 7.1 g/dL (ref 6.0–8.3)

## 2019-03-22 LAB — LIPID PANEL
Cholesterol: 163 mg/dL (ref 0–200)
HDL: 30.9 mg/dL — ABNORMAL LOW (ref >39.00)
NonHDL: 131.81
Total CHOL/HDL Ratio: 5
Triglycerides: 242 mg/dL — ABNORMAL HIGH (ref 0.0–149.0)
VLDL: 48.4 mg/dL — ABNORMAL HIGH (ref 0.0–40.0)

## 2019-03-22 LAB — TSH: TSH: 2.49 u[IU]/mL (ref 0.35–4.50)

## 2019-03-22 LAB — BASIC METABOLIC PANEL
BUN: 15 mg/dL (ref 6–23)
CO2: 26 mEq/L (ref 19–32)
Calcium: 9.5 mg/dL (ref 8.4–10.5)
Chloride: 102 mEq/L (ref 96–112)
Creatinine, Ser: 1.19 mg/dL (ref 0.40–1.50)
GFR: 62.08 mL/min (ref >60.00)
Glucose, Bld: 109 mg/dL — ABNORMAL HIGH (ref 70–99)
Potassium: 4 mEq/L (ref 3.5–5.1)
Sodium: 139 mEq/L (ref 135–145)

## 2019-03-22 LAB — PSA: PSA: 0.3 ng/mL (ref 0.10–4.00)

## 2019-03-22 NOTE — Patient Instructions (Signed)
Health Maintenance Due  Topic Date Due  . Hepatitis C Screening  08/12/1955  . HIV Screening  11/26/1972  . TETANUS/TDAP  11/26/1976    No flowsheet data found.

## 2019-03-22 NOTE — Progress Notes (Signed)
Subjective:    Patient ID: Jonathan Howard, male    DOB: 1956/04/12, 63 y.o.   MRN: [id number]  HPI Here for a well exam. He feels well in general but he still has an intermittent dry cough. He is working with Dr. Melvyn Novas and [facility], and so far he has had mildly reactive allergy tests and a clear CXR. He is scheduled for PFT's on 04-17-19.    Review of Systems  Constitutional: Negative.   HENT: Negative.   Eyes: Negative.   Respiratory: Positive for cough.   Cardiovascular: Negative.   Gastrointestinal: Negative.   Genitourinary: Negative.   Musculoskeletal: Negative.   Skin: Negative.   Neurological: Negative.   Psychiatric/Behavioral: Negative.        Objective:   Physical Exam Constitutional:      General: He is not in acute distress.    Appearance: He is well-developed. He is not diaphoretic.  HENT:     Head: Normocephalic and atraumatic.     Right Ear: External ear normal.     Left Ear: External ear normal.     Nose: Nose normal.     Mouth/Throat:     Pharynx: No oropharyngeal exudate.  Eyes:     General: No scleral icterus.       Right eye: No discharge.        Left eye: No discharge.     Conjunctiva/sclera: Conjunctivae normal.     Pupils: Pupils are equal, round, and reactive to light.  Neck:     Musculoskeletal: Neck supple.     Thyroid: No thyromegaly.     Vascular: No JVD.     Trachea: No tracheal deviation.  Cardiovascular:     Rate and Rhythm: Normal rate and regular rhythm.     Heart sounds: Normal heart sounds. No murmur. No friction rub. No gallop.   Pulmonary:     Effort: Pulmonary effort is normal. No respiratory distress.     Breath sounds: No rhonchi or rales.     Comments: Good air flow with soft expiratory wheezes  Chest:     Chest wall: No tenderness.  Abdominal:     General: Bowel sounds are normal. There is no distension.     Palpations: Abdomen is soft. There is no mass.     Tenderness: There is no abdominal tenderness. There is  no guarding or rebound.  Genitourinary:    Penis: Normal. No tenderness.      Scrotum/Testes: Normal.     Prostate: Normal.     Rectum: Normal. Guaiac result negative.  Musculoskeletal: Normal range of motion.        General: No tenderness.  Lymphadenopathy:     Cervical: No cervical adenopathy.  Skin:    General: Skin is warm and dry.     Coloration: Skin is not pale.     Findings: No erythema or rash.  Neurological:     Mental Status: He is alert and oriented to person, place, and time.     Cranial Nerves: No cranial nerve deficit.     Motor: No abnormal muscle tone.     Coordination: Coordination normal.     Deep Tendon Reflexes: Reflexes are normal and symmetric. Reflexes normal.  Psychiatric:        Behavior: Behavior normal.        Thought Content: Thought content normal.        Judgment: Judgment normal.           Assessment &  Plan:  Well exam. We discussed diet and exercise. Get fasting labs today. He will follow up with Pulmonary. His BP has been creeping back up (he was treated for HTN over 10 years ago but was able to come off meds by losing weight). He will monitor this at home and follow up as needed.  Alysia Penna, MD

## 2019-04-15 ENCOUNTER — Other Ambulatory Visit (HOSPITAL_COMMUNITY)
Admission: RE | Admit: 2019-04-15 | Discharge: 2019-04-15 | Disposition: A | Discharge status: Home / Self Care | Payer: BC Managed Care – PPO | Source: Ambulatory Visit | Attending: Internal Medicine | Admitting: Internal Medicine

## 2019-04-15 DIAGNOSIS — Z20828 Contact with and (suspected) exposure to other viral communicable diseases: Secondary | ICD-10-CM | POA: Diagnosis not present

## 2019-04-15 DIAGNOSIS — Z01812 Encounter for preprocedural laboratory examination: Secondary | ICD-10-CM | POA: Diagnosis not present

## 2019-04-15 LAB — SARS CORONAVIRUS 2 (TAT 6-24 HRS): SARS Coronavirus 2: NEGATIVE

## 2019-04-16 NOTE — Progress Notes (Signed)
Spoke with pt and notified of results per Dr. Wert. Pt verbalized understanding and denied any questions. 

## 2019-04-17 ENCOUNTER — Ambulatory Visit (INDEPENDENT_AMBULATORY_CARE_PROVIDER_SITE_OTHER): Payer: BC Managed Care – PPO | Admitting: Internal Medicine

## 2019-04-17 ENCOUNTER — Encounter: Payer: Self-pay | Admitting: Internal Medicine

## 2019-04-17 ENCOUNTER — Ambulatory Visit: Payer: BC Managed Care – PPO | Admitting: Internal Medicine

## 2019-04-17 ENCOUNTER — Other Ambulatory Visit: Payer: Self-pay

## 2019-04-17 DIAGNOSIS — R05 Cough: Secondary | ICD-10-CM

## 2019-04-17 DIAGNOSIS — R058 Other specified cough: Secondary | ICD-10-CM

## 2019-04-17 LAB — PULMONARY FUNCTION TEST
DL/VA % pred: 122 %
DL/VA: 5.1 ml/min/mmHg/L
DLCO cor % pred: 98 %
DLCO cor: 29.17 ml/min/mmHg
DLCO unc % pred: 102 %
DLCO unc: 30.48 ml/min/mmHg
FEF 25-75 Post: 6.32 L/sec
FEF 25-75 Pre: 5.5 L/sec
FEF2575-%Change-Post: 15 %
FEF2575-%Pred-Post: 199 %
FEF2575-%Pred-Pre: 173 %
FEV1-%Change-Post: 3 %
FEV1-%Pred-Post: 97 %
FEV1-%Pred-Pre: 93 %
FEV1-Post: 3.78 L
FEV1-Pre: 3.64 L
FEV1FVC-%Change-Post: 1 %
FEV1FVC-%Pred-Pre: 115 %
FEV6-%Change-Post: 1 %
FEV6-%Pred-Post: 85 %
FEV6-%Pred-Pre: 83 %
FEV6-Post: 4.2 L
FEV6-Pre: 4.13 L
FEV6FVC-%Change-Post: 0 %
FEV6FVC-%Pred-Post: 104 %
FEV6FVC-%Pred-Pre: 104 %
FVC-%Change-Post: 1 %
FVC-%Pred-Post: 82 %
FVC-%Pred-Pre: 80 %
FVC-Post: 4.24 L
FVC-Pre: 4.16 L
Post FEV1/FVC ratio: 89 %
Post FEV6/FVC ratio: 100 %
Pre FEV1/FVC ratio: 87 %
Pre FEV6/FVC Ratio: 100 %
RV % pred: 62 %
RV: 1.5 L
TLC % pred: 74 %
TLC: 5.62 L

## 2019-04-17 MED ORDER — MONTELUKAST SODIUM 10 MG PO TABS: 10.0000 mg | ORAL_TABLET | Freq: Every day | ORAL | 11 refills | Status: DC

## 2019-04-17 NOTE — Assessment & Plan Note (Signed)
Upper airway cough syndrome (previously labeled PNDS),  is so named because it's frequently impossible to sort out how much is  CR/sinusitis with freq throat clearing (which can be related to primary GERD or cat allergy)   vs  causing  secondary (" extra esophageal")  GERD from wide swings in gastric pressure that occur with throat clearing, often  promoting self use of mint and menthol lozenges that reduce the lower esophageal sphincter tone and exacerbate the problem further in a cyclical fashion.   These are the same pts (now being labeled as having "irritable larynx syndrome" by some cough centers) who not infrequently have a history of having failed to tolerate ace inhibitors,  dry powder inhalers or biphosphonates or report having atypical/extraesophageal reflux symptoms that don't respond to standard doses of PPI  and are easily confused as having aecopd or asthma flares by even experienced allergists/ pulmonologists (myself included).   However, no evidence for asthma here so will max rx for UACS related to gerd/ pnds and add singulair x one month and if not better refer to allergy and stop singulair.   Avoidance also reviewed.   Pulmonary f/u is prn    I had an extended discussion with the patient reviewing all relevant studies completed to date and  lasting 15 to 20 minutes of a 25 minute summary final office visit    Each maintenance medication was reviewed in detail including most importantly the difference between maintenance and prns and under what circumstances the prns are to be triggered using an action plan format that is not reflected in the computer generated alphabetically organized AVS.     Please see AVS for specific instructions unique to this visit that I personally wrote and verbalized to the the pt in detail and then reviewed with pt  by my nurse highlighting any  changes in therapy recommended at today's visit to their plan of care.

## 2019-04-17 NOTE — Patient Instructions (Signed)
Add pepcid 20 mg after supper and zyrtec 10 mg after supper or bedtime (otc's)   Ok to add singulair 10 mg every evening as a maintenance x one month - if not a lot better then call for allergy evaluation

## 2019-04-17 NOTE — Progress Notes (Signed)
Jonathan Howard, male    DOB: [birth year]     MRN: [id number]   Brief patient profile:  32  yowm never smoker never resp or allergy issues indolent onset persistent daily  cough May 2020 eval by [place] by Dr Sarajane Jews rx prilosec may have helped some but never cleared it so referred to pulmonary clinic 12/12/2018     History of Present Illness  12/12/2018  Pulmonary/ 1st office eval/Celia Gibbons  Chief Complaint  Patient presents with  . Pulmonary Consult    Self referral. Pt c/o cough x 3 months- non prod and worse when he first lies down and when air blows on him. Laughing also can trigger cough.   Dyspnea:  Not limited by breathing from desired activities  / weed eating ok  Cough: mostly dry mostly daytime and notes right at hs then goes away and sleeps fine  Sleep: on side  SABA use: none    Kouffman Reflux v Neurogenic Cough Differentiator Reflux Comments  Do you awaken from a sound sleep coughing violently?                            With trouble breathing? No no   Do you have choking episodes when you cannot  Get enough air, gasping for air ?               a few times    Do you usually cough when you lie down into  The bed, or when you just lie down to rest ?                          Yes   Do you usually cough after meals or eating?         occ  No pattern   Do you cough when (or after) you bend over?    No    GERD SCORE     Kouffman Reflux v Neurogenic Cough Differentiator Neurogenic   Do you more-or-less cough all day long? Very sporadic    Does change of temperature make you cough? A/c does it   Does laughing or chuckling cause you to cough? Yes/worst   Do fumes (perfume, automobile fumes, burned  Toast, etc.,) cause you to cough ?      no   Does speaking, singing, or talking on the phone cause you to cough   ?               Maybe talking    Neurogenic/Airway score    rec For cough / urge to cough > tessalon 200 mg every 6 hours as needed Depomedrol 120 mg IM  Protonix 40 mg  (prilosec 20 x 2)  Take 30- 60 min before your first and last meals of the day  GERD diet       12/26/2018 NP Cough better but not gone  Add Zyrtec 10mg  At bedtime   Tessalon  Three times a day  As needed  Cough  Continue on Protonix 40mg  daily before meal .  Albuterol Inhaler 2 puffs every 4hr as needed for wheezing .   04/17/2019  f/u ov/Selam Pietsch re: uacs with nl pfts  Chief Complaint  Patient presents with  . Follow-up    PFT done today. Cough has been better since the last visit. He has the feeling of mucus in the back of his throat.   not taking zyrtec  but is taking ppi  Ac  Dyspnea:  Not limited by breathing from desired activities  / in all conditions / walks golfing  Cough: after supper worse, usually not waking him up, min mucoid production though has mild globus sensation most of the day Sleeping: on side flat bed one pillow SABA use: none  02: none  Still has two cats, the most recent added about the same time cough started    No obvious day to day or daytime variability or assoc excess/ purulent sputum or mucus plugs or hemoptysis or cp or chest tightness, subjective wheeze or overt sinus or hb symptoms.   Sleeping as above without nocturnal  or early am exacerbation  of respiratory  c/o's or need for noct saba. Also denies any obvious fluctuation of symptoms with weather or environmental changes or other aggravating or alleviating factors except as outlined above   No unusual exposure hx or h/o childhood pna/ asthma or knowledge of premature birth.  Current Allergies, Complete Past Medical History, Past Surgical History, Family History, and Social History were reviewed in [employer] record.  ROS  The following are not active complaints unless bolded Hoarseness, sore throat, dysphagia, dental problems, itching, sneezing,  nasal congestion or discharge of excess mucus or purulent secretions, ear ache,   fever, chills, sweats, unintended wt loss or wt  gain, classically pleuritic or exertional cp,  orthopnea pnd or arm/hand swelling  or leg swelling, presyncope, palpitations, abdominal pain, anorexia, nausea, vomiting, diarrhea  or change in bowel habits or change in bladder habits, change in stools or change in urine, dysuria, hematuria,  rash, arthralgias, visual complaints, headache, numbness, weakness or ataxia or problems with walking or coordination,  change in mood or  memory.        Current Meds  Medication Sig  . atorvastatin (LIPITOR) 20 MG tablet TAKE 1 TABLET BY MOUTH EVERY DAY  . benzonatate (TESSALON) 200 MG capsule Take 1 capsule (200 mg total) by mouth 3 (three) times daily as needed for cough.  . cyclobenzaprine (FLEXERIL) 10 MG tablet Take 1 tablet (10 mg total) by mouth 3 (three) times daily as needed for muscle spasms.  . fenofibrate 160 MG tablet TAKE 1 TABLET BY MOUTH EVERY DAY  . pantoprazole (PROTONIX) 40 MG tablet TAKE 30- 60 MIN BEFORE YOUR FIRST AND LAST MEALS OF THE DAY  . triamcinolone cream (KENALOG) 0.1 % Apply 1 application topically 2 (two) times daily.               Objective:     Wt Readings from Last 3 Encounters:  04/17/19 219 lb (99.3 kg)  03/22/19 218 lb (98.9 kg)  12/26/18 219 lb 12.8 oz (99.7 kg)     Vital signs reviewed - Note on arrival 02 sats  98% on RA      amb wm nad    HEENT : pt wearing mask not removed for exam due to covid -19 concerns.    NECK :  without JVD/Nodes/TM/ nl carotid upstrokes bilaterally   LUNGS: no acc muscle use,  Nl contour chest which is clear to A and P bilaterally without cough on insp or exp maneuvers   CV:  RRR  no s3 or murmur or increase in P2, and no edema   ABD:  soft and nontender with nl inspiratory excursion in the supine position. No bruits or organomegaly appreciated, bowel sounds nl  MS:  Nl gait/ ext warm without deformities, calf tenderness, cyanosis or  clubbing No obvious joint restrictions   SKIN: warm and dry without lesions     NEURO:  alert, approp, nl sensorium with  no motor or cerebellar deficits apparent.     Assessment

## 2019-04-17 NOTE — Progress Notes (Signed)
Full PFT performed today. °

## 2019-05-27 ENCOUNTER — Other Ambulatory Visit: Payer: Self-pay | Admitting: Internal Medicine

## 2019-05-27 DIAGNOSIS — R058 Other specified cough: Secondary | ICD-10-CM

## 2019-05-27 DIAGNOSIS — R05 Cough: Secondary | ICD-10-CM

## 2019-09-23 ENCOUNTER — Other Ambulatory Visit: Payer: Self-pay

## 2019-09-24 ENCOUNTER — Encounter: Payer: Self-pay | Admitting: Family Medicine

## 2019-09-24 ENCOUNTER — Ambulatory Visit: Payer: BC Managed Care – PPO | Admitting: Family Medicine

## 2019-09-24 ENCOUNTER — Other Ambulatory Visit (INDEPENDENT_AMBULATORY_CARE_PROVIDER_SITE_OTHER): Payer: BC Managed Care – PPO

## 2019-09-24 VITALS — BP 160/80 | HR 62 | Temp 97.2°F | Wt 220.2 lb

## 2019-09-24 DIAGNOSIS — E538 Deficiency of other specified B group vitamins: Secondary | ICD-10-CM

## 2019-09-24 DIAGNOSIS — E785 Hyperlipidemia, unspecified: Secondary | ICD-10-CM | POA: Diagnosis not present

## 2019-09-24 DIAGNOSIS — I10 Essential (primary) hypertension: Secondary | ICD-10-CM

## 2019-09-24 DIAGNOSIS — R7309 Other abnormal glucose: Secondary | ICD-10-CM | POA: Diagnosis not present

## 2019-09-24 LAB — HEPATIC FUNCTION PANEL
ALT: 46 U/L (ref 0–53)
AST: 40 U/L — ABNORMAL HIGH (ref 0–37)
Albumin: 4.6 g/dL (ref 3.5–5.2)
Alkaline Phosphatase: 38 U/L — ABNORMAL LOW (ref 39–117)
Bilirubin, Direct: 0.1 mg/dL (ref 0.0–0.3)
Total Bilirubin: 0.6 mg/dL (ref 0.2–1.2)
Total Protein: 7.1 g/dL (ref 6.0–8.3)

## 2019-09-24 LAB — LIPID PANEL
Cholesterol: 149 mg/dL (ref 0–200)
HDL: 27 mg/dL — ABNORMAL LOW (ref >39.00)
NonHDL: 121.68
Total CHOL/HDL Ratio: 6
Triglycerides: 291 mg/dL — ABNORMAL HIGH (ref 0.0–149.0)
VLDL: 58.2 mg/dL — ABNORMAL HIGH (ref 0.0–40.0)

## 2019-09-24 LAB — HEMOGLOBIN A1C: Hgb A1c MFr Bld: 6.6 % — ABNORMAL HIGH (ref 4.6–6.5)

## 2019-09-24 LAB — LDL CHOLESTEROL, DIRECT: Direct LDL: 90 mg/dL

## 2019-09-24 LAB — VITAMIN B12: Vitamin B-12: 201 pg/mL — ABNORMAL LOW (ref 211–911)

## 2019-09-24 MED ORDER — ATORVASTATIN CALCIUM 20 MG PO TABS: ORAL_TABLET | ORAL | 3 refills | Status: DC

## 2019-09-24 MED ORDER — LOSARTAN POTASSIUM 50 MG PO TABS: 50.0000 mg | ORAL_TABLET | Freq: Every day | ORAL | 3 refills | Status: DC

## 2019-09-24 NOTE — Progress Notes (Signed)
   Subjective:    Patient ID: Jonathan Howard, male    DOB: 1955-10-20, 64 y.o.   MRN: [id number]  HPI Here to follow up on hyperglycemia and dyslipidemia. He also complains of one month of intermittent pressure in the head and lightheadedness. No headaches. No chest pain or SOB. He was treated for HTN 10 years ago but he has been off medications since then.    Review of Systems  Constitutional: Negative.   Respiratory: Negative.   Cardiovascular: Negative.   Neurological: Positive for light-headedness. Negative for headaches.       Objective:   Physical Exam Constitutional:      Appearance: Normal appearance.  Cardiovascular:     Rate and Rhythm: Normal rate and regular rhythm.     Pulses: Normal pulses.     Heart sounds: Normal heart sounds.  Pulmonary:     Effort: Pulmonary effort is normal.     Breath sounds: Normal breath sounds.  Musculoskeletal:     Right lower leg: No edema.     Left lower leg: No edema.  Neurological:     General: No focal deficit present.     Mental Status: He is alert and oriented to person, place, and time.           Assessment & Plan:  He is fasting so we will send him for lipids and a liver panel and an A1c. His HTN has returned and I think this is the cause of his lightheadedness. He will start on Losartan 50 mg daily and recheck in 4 weeks. Alysia Penna, MD

## 2019-09-25 ENCOUNTER — Other Ambulatory Visit: Payer: Self-pay

## 2019-09-26 ENCOUNTER — Ambulatory Visit (INDEPENDENT_AMBULATORY_CARE_PROVIDER_SITE_OTHER): Payer: BC Managed Care – PPO

## 2019-09-26 DIAGNOSIS — E538 Deficiency of other specified B group vitamins: Secondary | ICD-10-CM | POA: Diagnosis not present

## 2019-09-26 MED ORDER — CYANOCOBALAMIN 1000 MCG/ML IJ SOLN
1000.0000 ug | Freq: Once | INTRAMUSCULAR | Status: AC
  Administered 2019-09-26: 1000 ug via INTRAMUSCULAR

## 2019-09-26 NOTE — Progress Notes (Signed)
Per orders of Dr.Fry , injection of B12 given in Right  deltoid by Franco Collet. Patient tolerated injection well.

## 2019-09-26 NOTE — Patient Instructions (Signed)
Health Maintenance Due  Topic Date Due  . Hepatitis C Screening  Never done  . HIV Screening  Never done    No flowsheet data found.

## 2019-10-02 ENCOUNTER — Other Ambulatory Visit: Payer: Self-pay

## 2019-10-03 ENCOUNTER — Ambulatory Visit (INDEPENDENT_AMBULATORY_CARE_PROVIDER_SITE_OTHER): Payer: BC Managed Care – PPO | Admitting: *Deleted

## 2019-10-03 DIAGNOSIS — E538 Deficiency of other specified B group vitamins: Secondary | ICD-10-CM

## 2019-10-03 MED ORDER — CYANOCOBALAMIN 1000 MCG/ML IJ SOLN
1000.0000 ug | Freq: Once | INTRAMUSCULAR | Status: AC
  Administered 2019-10-03: 1000 ug via INTRAMUSCULAR

## 2019-10-03 NOTE — Progress Notes (Signed)
Per orders of Dr. Fry, injection of B12 given by Glendale Youngblood. Patient tolerated injection well. 

## 2019-10-10 ENCOUNTER — Other Ambulatory Visit: Payer: Self-pay

## 2019-10-10 ENCOUNTER — Ambulatory Visit (INDEPENDENT_AMBULATORY_CARE_PROVIDER_SITE_OTHER): Payer: BC Managed Care – PPO

## 2019-10-10 DIAGNOSIS — E538 Deficiency of other specified B group vitamins: Secondary | ICD-10-CM

## 2019-10-10 MED ORDER — CYANOCOBALAMIN 1000 MCG/ML IJ SOLN
1000.0000 ug | Freq: Once | INTRAMUSCULAR | Status: AC
  Administered 2019-10-10: 1000 ug via INTRAMUSCULAR

## 2019-10-10 NOTE — Progress Notes (Signed)
Per orders of Dr.Fry , injection of B12 given in Right deltoid by Franco Collet. Patient tolerated injection well.

## 2019-10-10 NOTE — Patient Instructions (Signed)
Health Maintenance Due  Topic Date Due  . Hepatitis C Screening  Never done  . HIV Screening  Never done    No flowsheet data found.

## 2019-10-17 ENCOUNTER — Other Ambulatory Visit: Payer: Self-pay

## 2019-10-17 ENCOUNTER — Ambulatory Visit (INDEPENDENT_AMBULATORY_CARE_PROVIDER_SITE_OTHER): Payer: BC Managed Care – PPO

## 2019-10-17 DIAGNOSIS — E538 Deficiency of other specified B group vitamins: Secondary | ICD-10-CM

## 2019-10-17 MED ORDER — CYANOCOBALAMIN 1000 MCG/ML IJ SOLN
1000.0000 ug | Freq: Once | INTRAMUSCULAR | Status: AC
  Administered 2019-10-17: 1000 ug via INTRAMUSCULAR

## 2019-10-17 NOTE — Patient Instructions (Signed)
Health Maintenance Due  Topic Date Due  . Hepatitis C Screening  Never done  . HIV Screening  Never done    No flowsheet data found.

## 2019-10-17 NOTE — Progress Notes (Signed)
Per orders of B12 , injection of B12 given in Left deltoid by Franco Collet. Patient tolerated injection well.   Pt came in stating that he is still feeling light headed and his gate is off. After reviewing pt last ov I check pt Bp which was 140/82. Spoke to Dr.Fry and he advised that pt should come in next week. Pt has been scheduled!

## 2019-10-23 ENCOUNTER — Other Ambulatory Visit: Payer: Self-pay

## 2019-10-23 ENCOUNTER — Encounter: Payer: Self-pay | Admitting: Family Medicine

## 2019-10-23 ENCOUNTER — Ambulatory Visit: Payer: BC Managed Care – PPO | Admitting: Family Medicine

## 2019-10-23 VITALS — BP 122/80 | HR 51 | Temp 98.0°F | Ht 72.05 in | Wt 218.0 lb

## 2019-10-23 DIAGNOSIS — E538 Deficiency of other specified B group vitamins: Secondary | ICD-10-CM | POA: Diagnosis not present

## 2019-10-23 DIAGNOSIS — I10 Essential (primary) hypertension: Secondary | ICD-10-CM | POA: Diagnosis not present

## 2019-10-23 MED ORDER — CYANOCOBALAMIN 1000 MCG/ML IJ SOLN
1000.0000 ug | Freq: Once | INTRAMUSCULAR | Status: AC
  Administered 2019-10-23: 1000 ug via INTRAMUSCULAR

## 2019-10-23 NOTE — Patient Instructions (Signed)
Health Maintenance Due  Topic Date Due  . Hepatitis C Screening  Never done  . HIV Screening  Never done    No flowsheet data found.

## 2019-10-23 NOTE — Progress Notes (Signed)
   Subjective:    Patient ID: Jonathan Howard, male    DOB: January 26, 1956, 64 y.o.   MRN: [id number]  HPI Here to follow up on HTN. He was here 4 weeks ago with high BP ans he was started on Losartan 50 mg daily. He took this for 3 weeks, but he then stopped taking it because he felt so bad. He felt weak and dizzy, but now he feels back to normal. He has been getting weekly B12 shots.    Review of Systems  Constitutional: Negative.   Respiratory: Negative.   Cardiovascular: Negative.   Neurological: Negative.        Objective:   Physical Exam Constitutional:      Appearance: Normal appearance.  Cardiovascular:     Rate and Rhythm: Normal rate and regular rhythm.     Pulses: Normal pulses.     Heart sounds: Normal heart sounds.  Pulmonary:     Effort: Pulmonary effort is normal.     Breath sounds: Normal breath sounds.  Neurological:     General: No focal deficit present.     Mental Status: He is alert and oriented to person, place, and time.           Assessment & Plan:  HTN. He now has a normal BP off Losartan, so we agreed to stay off all BP medications for now. He will get weekly B12 shots, and we plan to follow his BP each time.  Alysia Penna, MD

## 2019-10-24 ENCOUNTER — Ambulatory Visit: Payer: BC Managed Care – PPO

## 2019-10-30 ENCOUNTER — Ambulatory Visit (INDEPENDENT_AMBULATORY_CARE_PROVIDER_SITE_OTHER): Payer: BC Managed Care – PPO | Admitting: *Deleted

## 2019-10-30 ENCOUNTER — Ambulatory Visit: Payer: BC Managed Care – PPO

## 2019-10-30 ENCOUNTER — Other Ambulatory Visit: Payer: Self-pay

## 2019-10-30 VITALS — BP 130/84

## 2019-10-30 DIAGNOSIS — E538 Deficiency of other specified B group vitamins: Secondary | ICD-10-CM

## 2019-10-30 MED ORDER — CYANOCOBALAMIN 1000 MCG/ML IJ SOLN
1000.0000 ug | Freq: Once | INTRAMUSCULAR | Status: AC
  Administered 2019-10-30: 1000 ug via INTRAMUSCULAR

## 2019-10-30 NOTE — Progress Notes (Signed)
Per orders of Dr. Fry, injection of B 12 given by Zyanna Leisinger S Marianna Cid. Patient tolerated injection well.  

## 2019-11-05 NOTE — Patient Instructions (Signed)
Health Maintenance Due  Topic Date Due  . Hepatitis C Screening  Never done  . HIV Screening  Never done    No flowsheet data found.

## 2019-11-06 ENCOUNTER — Ambulatory Visit (INDEPENDENT_AMBULATORY_CARE_PROVIDER_SITE_OTHER): Payer: BC Managed Care – PPO

## 2019-11-06 ENCOUNTER — Other Ambulatory Visit: Payer: Self-pay

## 2019-11-06 VITALS — BP 136/80

## 2019-11-06 DIAGNOSIS — E538 Deficiency of other specified B group vitamins: Secondary | ICD-10-CM

## 2019-11-06 MED ORDER — CYANOCOBALAMIN 1000 MCG/ML IJ SOLN
1000.0000 ug | Freq: Once | INTRAMUSCULAR | Status: AC
  Administered 2019-11-06: 1000 ug via INTRAMUSCULAR

## 2019-11-06 NOTE — Progress Notes (Signed)
Pt came in for his B12 injection. Injection given in the left deltoid & pt tolerated well.

## 2019-11-13 ENCOUNTER — Other Ambulatory Visit: Payer: Self-pay

## 2019-11-13 ENCOUNTER — Ambulatory Visit (INDEPENDENT_AMBULATORY_CARE_PROVIDER_SITE_OTHER): Payer: BC Managed Care – PPO

## 2019-11-13 DIAGNOSIS — E538 Deficiency of other specified B group vitamins: Secondary | ICD-10-CM

## 2019-11-13 MED ORDER — CYANOCOBALAMIN 1000 MCG/ML IJ SOLN
1000.0000 ug | Freq: Once | INTRAMUSCULAR | Status: AC
  Administered 2019-11-13: 1000 ug via INTRAMUSCULAR

## 2019-11-13 NOTE — Progress Notes (Signed)
Per orders of Dr.Fry , injection of B12 given in R deltoid by Franco Collet. Patient tolerated injection well.

## 2019-11-13 NOTE — Patient Instructions (Signed)
Health Maintenance Due  Topic Date Due  . Hepatitis C Screening  Never done  . HIV Screening  Never done    No flowsheet data found.

## 2019-11-20 ENCOUNTER — Ambulatory Visit: Payer: BC Managed Care – PPO

## 2019-11-21 ENCOUNTER — Ambulatory Visit (INDEPENDENT_AMBULATORY_CARE_PROVIDER_SITE_OTHER): Payer: BC Managed Care – PPO

## 2019-11-21 ENCOUNTER — Other Ambulatory Visit: Payer: Self-pay

## 2019-11-21 DIAGNOSIS — E538 Deficiency of other specified B group vitamins: Secondary | ICD-10-CM

## 2019-11-21 MED ORDER — CYANOCOBALAMIN 1000 MCG/ML IJ SOLN
1000.0000 ug | Freq: Once | INTRAMUSCULAR | Status: AC
  Administered 2019-11-21: 1000 ug via INTRAMUSCULAR

## 2019-11-21 NOTE — Progress Notes (Signed)
Per orders of Dr. Alysia Penna, injection of B12 was given in Left Deltoid.  Patient tolerated the injection well.

## 2019-11-28 ENCOUNTER — Ambulatory Visit (INDEPENDENT_AMBULATORY_CARE_PROVIDER_SITE_OTHER): Payer: BC Managed Care – PPO

## 2019-11-28 ENCOUNTER — Other Ambulatory Visit: Payer: Self-pay

## 2019-11-28 DIAGNOSIS — E538 Deficiency of other specified B group vitamins: Secondary | ICD-10-CM

## 2019-11-28 MED ORDER — CYANOCOBALAMIN 1000 MCG/ML IJ SOLN
1000.0000 ug | Freq: Once | INTRAMUSCULAR | Status: AC
  Administered 2019-11-28: 1000 ug via INTRAMUSCULAR

## 2019-11-28 NOTE — Progress Notes (Signed)
Per orders of Dr. Fry, injection of B12 given by Nathanal Hermiz. Patient tolerated injection well.  

## 2019-12-05 ENCOUNTER — Ambulatory Visit (INDEPENDENT_AMBULATORY_CARE_PROVIDER_SITE_OTHER): Payer: BC Managed Care – PPO | Admitting: *Deleted

## 2019-12-05 ENCOUNTER — Other Ambulatory Visit: Payer: Self-pay

## 2019-12-05 DIAGNOSIS — E538 Deficiency of other specified B group vitamins: Secondary | ICD-10-CM

## 2019-12-05 MED ORDER — CYANOCOBALAMIN 1000 MCG/ML IJ SOLN
1000.0000 ug | Freq: Once | INTRAMUSCULAR | Status: AC
  Administered 2019-12-05: 1000 ug via INTRAMUSCULAR

## 2019-12-05 NOTE — Progress Notes (Signed)
Per orders of Dr. Fry, injection of Cyanocobalamin 1000mcg given by Min Collymore A. Patient tolerated injection well.  

## 2019-12-12 ENCOUNTER — Other Ambulatory Visit: Payer: Self-pay

## 2019-12-12 ENCOUNTER — Ambulatory Visit (INDEPENDENT_AMBULATORY_CARE_PROVIDER_SITE_OTHER): Payer: BC Managed Care – PPO | Admitting: *Deleted

## 2019-12-12 DIAGNOSIS — E538 Deficiency of other specified B group vitamins: Secondary | ICD-10-CM

## 2019-12-12 MED ORDER — CYANOCOBALAMIN 1000 MCG/ML IJ SOLN
1000.0000 ug | Freq: Once | INTRAMUSCULAR | Status: AC
  Administered 2019-12-12: 1000 ug via INTRAMUSCULAR

## 2019-12-12 NOTE — Progress Notes (Signed)
Per orders of Dr. Fry, injection of Cyanocobalamin 1000mcg given by Kesia Dalto A. Patient tolerated injection well.  

## 2019-12-19 ENCOUNTER — Ambulatory Visit (INDEPENDENT_AMBULATORY_CARE_PROVIDER_SITE_OTHER): Payer: BC Managed Care – PPO | Admitting: *Deleted

## 2019-12-19 ENCOUNTER — Other Ambulatory Visit: Payer: Self-pay

## 2019-12-19 DIAGNOSIS — E538 Deficiency of other specified B group vitamins: Secondary | ICD-10-CM | POA: Diagnosis not present

## 2019-12-19 MED ORDER — CYANOCOBALAMIN 1000 MCG/ML IJ SOLN
1000.0000 ug | Freq: Once | INTRAMUSCULAR | Status: AC
  Administered 2019-12-19: 1000 ug via INTRAMUSCULAR

## 2019-12-19 NOTE — Progress Notes (Signed)
Per orders of Dr. Fry, injection of Cyanocobalamin 1000mcg given by Graig Hessling A. Patient tolerated injection well.  

## 2019-12-26 ENCOUNTER — Other Ambulatory Visit: Payer: Self-pay

## 2019-12-26 ENCOUNTER — Other Ambulatory Visit: Payer: BC Managed Care – PPO

## 2019-12-26 DIAGNOSIS — E538 Deficiency of other specified B group vitamins: Secondary | ICD-10-CM | POA: Diagnosis not present

## 2019-12-27 LAB — VITAMIN B12: Vitamin B-12: 544 pg/mL (ref 200–1100)

## 2020-01-02 ENCOUNTER — Other Ambulatory Visit: Payer: Self-pay

## 2020-01-02 MED ORDER — CYANOCOBALAMIN 1000 MCG/ML IJ SOLN: 1000.0000 ug | INTRAMUSCULAR | 11 refills | Status: DC

## 2020-01-02 MED ORDER — "LUER LOCK SAFETY SYRINGES 25G X 1"" 3 ML MISC": 0 refills | Status: AC

## 2020-01-22 ENCOUNTER — Encounter: Payer: Self-pay | Admitting: Gastroenterology

## 2020-02-03 ENCOUNTER — Ambulatory Visit (AMBULATORY_SURGERY_CENTER): Payer: Self-pay | Admitting: *Deleted

## 2020-02-03 ENCOUNTER — Encounter: Payer: Self-pay | Admitting: Gastroenterology

## 2020-02-03 ENCOUNTER — Other Ambulatory Visit: Payer: Self-pay

## 2020-02-03 VITALS — Ht 71.75 in | Wt 218.0 lb

## 2020-02-03 DIAGNOSIS — Z1211 Encounter for screening for malignant neoplasm of colon: Secondary | ICD-10-CM

## 2020-02-03 MED ORDER — SUTAB 1479-225-188 MG PO TABS: 24.0000 | ORAL_TABLET | ORAL | 0 refills | Status: DC

## 2020-02-03 NOTE — Progress Notes (Signed)
cov vax x 2  No egg or soy allergy known to patient  No issues with past sedation with any surgeries or procedures- last colon worried with low HR but no issues  no intubation problems in the past  No FH of Malignant Hyperthermia No diet pills per patient No home 02 use per patient  No blood thinners per patient  Pt denies issues with constipation  No A fib or A flutter  EMMI video to pt or via [place] 19 guidelines implemented in PV today with Pt and RN   [employer] given to pt in PV today , Code to Pharmacy   Due to the COVID-19 pandemic we are asking patients to follow these guidelines. Please only bring one care partner. Please be aware that your care partner may wait in the car in the parking lot or if they feel like they will be too hot to wait in the car, they may wait in the lobby on the 4th floor. All care partners are required to wear a mask the entire time (we do not have any that we can provide them), they need to practice social distancing, and we will do a Covid check for all patient's and care partners when you arrive. Also we will check their temperature and your temperature. If the care partner waits in their car they need to stay in the parking lot the entire time and we will call them on their cell phone when the patient is ready for discharge so they can bring the car to the front of the building. Also all patient's will need to wear a mask into building.

## 2020-02-12 ENCOUNTER — Other Ambulatory Visit: Payer: Self-pay

## 2020-02-12 ENCOUNTER — Ambulatory Visit (INDEPENDENT_AMBULATORY_CARE_PROVIDER_SITE_OTHER): Payer: BC Managed Care – PPO | Admitting: *Deleted

## 2020-02-12 DIAGNOSIS — E538 Deficiency of other specified B group vitamins: Secondary | ICD-10-CM | POA: Diagnosis not present

## 2020-02-12 MED ORDER — CYANOCOBALAMIN 1000 MCG/ML IJ SOLN
1000.0000 ug | Freq: Once | INTRAMUSCULAR | Status: AC
  Administered 2020-02-12: 1000 ug via INTRAMUSCULAR

## 2020-02-12 NOTE — Progress Notes (Signed)
Patient in today for B-12 injection and teaching for wife to give. Wife taught how to drop up medication and administer into deltoid muscle. Patient wife taught aseptic technique and landmarks

## 2020-02-17 ENCOUNTER — Other Ambulatory Visit: Payer: Self-pay

## 2020-02-17 ENCOUNTER — Ambulatory Visit (AMBULATORY_SURGERY_CENTER): Payer: BC Managed Care – PPO | Admitting: Gastroenterology

## 2020-02-17 ENCOUNTER — Encounter: Payer: Self-pay | Admitting: Gastroenterology

## 2020-02-17 VITALS — BP 149/83 | HR 50 | Temp 98.4°F | Resp 24 | Ht 71.5 in | Wt 218.0 lb

## 2020-02-17 DIAGNOSIS — D122 Benign neoplasm of ascending colon: Secondary | ICD-10-CM

## 2020-02-17 DIAGNOSIS — Z1211 Encounter for screening for malignant neoplasm of colon: Secondary | ICD-10-CM

## 2020-02-17 DIAGNOSIS — D125 Benign neoplasm of sigmoid colon: Secondary | ICD-10-CM | POA: Diagnosis not present

## 2020-02-17 HISTORY — PX: COLONOSCOPY: SHX174

## 2020-02-17 MED ORDER — SODIUM CHLORIDE 0.9 % IV SOLN: 500.0000 mL | INTRAVENOUS | Status: DC

## 2020-02-17 NOTE — Progress Notes (Signed)
To PACU, VSS. Report to [email or web address] 

## 2020-02-17 NOTE — Patient Instructions (Signed)
Handout given for polyps.  YOU HAD AN ENDOSCOPIC PROCEDURE TODAY AT THE [facility]:   Refer to the procedure report that was given to you for any specific questions about what was found during the examination.  If the procedure report does not answer your questions, please call your gastroenterologist to clarify.  If you requested that your care partner not be given the details of your procedure findings, then the procedure report has been included in a sealed envelope for you to review at your convenience later.  YOU SHOULD EXPECT: Some feelings of bloating in the abdomen. Passage of more gas than usual.  Walking can help get rid of the air that was put into your GI tract during the procedure and reduce the bloating. If you had a lower endoscopy (such as a colonoscopy or flexible sigmoidoscopy) you may notice spotting of blood in your stool or on the toilet paper. If you underwent a bowel prep for your procedure, you may not have a normal bowel movement for a few days.  Please Note:  You might notice some irritation and congestion in your nose or some drainage.  This is from the oxygen used during your procedure.  There is no need for concern and it should clear up in a day or so.  SYMPTOMS TO REPORT IMMEDIATELY:   Following lower endoscopy (colonoscopy or flexible sigmoidoscopy):  Excessive amounts of blood in the stool  Significant tenderness or worsening of abdominal pains  Swelling of the abdomen that is new, acute  Fever of 100F or higher  For urgent or emergent issues, a gastroenterologist can be reached at any hour by calling [phone]. Do not use MyChart messaging for urgent concerns.    DIET:  We do recommend a small meal at first, but then you may proceed to your regular diet.  Drink plenty of fluids but you should avoid alcoholic beverages for 24 hours.  ACTIVITY:  You should plan to take it easy for the rest of today and you should NOT DRIVE or use heavy  machinery until tomorrow (because of the sedation medicines used during the test).    FOLLOW UP: Our staff will call the number listed on your records 48-72 hours following your procedure to check on you and address any questions or concerns that you may have regarding the information given to you following your procedure. If we do not reach you, we will leave a message.  We will attempt to reach you two times.  During this call, we will ask if you have developed any symptoms of COVID 19. If you develop any symptoms (ie: fever, flu-like symptoms, shortness of breath, cough etc.) before then, please call [phone].  If you test positive for Covid 19 in the 2 weeks post procedure, please call and report this information to us.    If any biopsies were taken you will be contacted by phone or by letter within the next 1-3 weeks.  Please call us at [phone] if you have not heard about the biopsies in 3 weeks.    SIGNATURES/CONFIDENTIALITY: You and/or your care partner have signed paperwork which will be entered into your electronic medical record.  These signatures attest to the fact that that the information above on your After Visit Summary has been reviewed and is understood.  Full responsibility of the confidentiality of this discharge information lies with you and/or your care-partner. 

## 2020-02-17 NOTE — Progress Notes (Signed)
Pt's states no medical or surgical changes since previsit or office visit. 

## 2020-02-17 NOTE — Op Note (Signed)
[facility] Patient Name: Jonathan Howard Procedure Date: 02/17/2020 1:21 PM MRN: [id number] Endoscopist: Thornton Park MD, MD Age: 64 Referring MD:  Date of Birth: [birth year] Gender: Male Account #: [id number] Procedure:                Colonoscopy Indications:              Screening for colorectal malignant neoplasm                           Normal colonoscopy 2011 with Dr. Deatra Ina                           No known family history of colon cancer or polyps Medicines:                Monitored Anesthesia Care Procedure:                Pre-Anesthesia Assessment:                           - Prior to the procedure, a History and Physical                            was performed, and patient medications and                            allergies were reviewed. The patient's tolerance of                            previous anesthesia was also reviewed. The risks                            and benefits of the procedure and the sedation                            options and risks were discussed with the patient.                            All questions were answered, and informed consent                            was obtained. Prior Anticoagulants: The patient has                            taken no previous anticoagulant or antiplatelet                            agents. ASA Grade Assessment: II - A patient with                            mild systemic disease. After reviewing the risks                            and benefits, the patient was deemed in  satisfactory condition to undergo the procedure.                           After obtaining informed consent, the colonoscope                            was passed under direct vision. Throughout the                            procedure, the patient's blood pressure, pulse, and                            oxygen saturations were monitored continuously. The                            Colonoscope was introduced  through the anus and                            advanced to the 3 cm into the ileum. A second                            forward view of the right colon was performed. The                            colonoscopy was performed without difficulty. The                            patient tolerated the procedure well. The quality                            of the bowel preparation was excellent. The                            terminal ileum, ileocecal valve, appendiceal                            orifice, and rectum were photographed. Scope In: 1:27:58 PM Scope Out: 1:48:37 PM Scope Withdrawal Time: 0 hours 16 minutes 52 seconds  Total Procedure Duration: 0 hours 20 minutes 39 seconds  Findings:                 The perianal and digital rectal examinations were                            normal.                           A less than 1 mm polyp was found in the ascending                            colon. The polyp was flat. The polyp was removed                            with a cold biopsy forceps. Resection and retrieval  were complete. Estimated blood loss was minimal.                           Two sessile polyps were found in the ascending                            colon. The polyps were 2 mm in size. These polyps                            were removed with a cold snare. Resection and                            retrieval were complete. Estimated blood loss was                            minimal.                           A 2 mm polyp was found in the distal sigmoid colon                            and a 3 mm polyp was found in the descending colon.                            The polyps were sessile. The polyps were removed                            with a cold snare. Resection and retrieval were                            complete. Estimated blood loss was minimal.                           The exam was otherwise without abnormality on                             direct and retroflexion views. Complications:            No immediate complications. Estimated blood loss:                            Minimal. Estimated Blood Loss:     Estimated blood loss was minimal. Impression:               - One less than 1 mm polyp in the ascending colon,                            removed with a cold biopsy forceps. Resected and                            retrieved.                           - Two 2 mm polyps in the ascending colon,  removed                            with a cold snare. Resected and retrieved.                           - Two 2 mm polyp in the distal sigmoid colon and                            descending colon, removed with a cold snare.                            Resected and retrieved.                           - The examination was otherwise normal on direct                            and retroflexion views. Recommendation:           - Patient has a contact number available for                            emergencies. The signs and symptoms of potential                            delayed complications were discussed with the                            patient. Return to normal activities tomorrow.                            Written discharge instructions were provided to the                            patient.                           - Resume previous diet.                           - Continue present medications.                           - Await pathology results.                           - Repeat colonoscopy date to be determined after                            pending pathology results are reviewed for                            surveillance.                           - Emerging evidence supports eating a diet of  fruits, vegetables, grains, calcium, and yogurt                            while reducing red meat and alcohol may reduce the                            risk of colon cancer.                            - Thank you for allowing me to be involved in your                            colon cancer prevention. Thornton Park MD, MD 02/17/2020 1:56:32 PM This report has been signed electronically.

## 2020-02-19 ENCOUNTER — Telehealth: Payer: Self-pay

## 2020-02-19 NOTE — Telephone Encounter (Signed)
°  Follow up Call-  Call back number 02/17/2020  Post procedure Call Back phone  # [phone]  Permission to leave phone message Yes  Some recent data might be hidden     Patient questions:  Do you have a fever, pain , or abdominal swelling? No. Pain Score  0 *  Have you tolerated food without any problems? Yes.    Have you been able to return to your normal activities? Yes.    Do you have any questions about your discharge instructions: Diet   No. Medications  No. Follow up visit  No.  Do you have questions or concerns about your Care? No.  Actions: * If pain score is 4 or above: 1. No action needed, pain <4.Have you developed a fever since your procedure? no  2.   Have you had an respiratory symptoms (SOB or cough) since your procedure? no  3.   Have you tested positive for COVID 19 since your procedure no  4.   Have you had any family members/close contacts diagnosed with the COVID 19 since your procedure?  no   If yes to any of these questions please route to Joylene Eryk, RN and Joella Prince, RN

## 2020-02-24 ENCOUNTER — Encounter: Payer: Self-pay | Admitting: Gastroenterology

## 2020-04-05 ENCOUNTER — Other Ambulatory Visit: Payer: Self-pay | Admitting: Family Medicine

## 2020-07-24 ENCOUNTER — Ambulatory Visit (INDEPENDENT_AMBULATORY_CARE_PROVIDER_SITE_OTHER): Payer: 59 | Admitting: Family Medicine

## 2020-07-24 ENCOUNTER — Encounter: Payer: Self-pay | Admitting: Family Medicine

## 2020-07-24 ENCOUNTER — Other Ambulatory Visit: Payer: Self-pay

## 2020-07-24 VITALS — BP 128/70 | HR 57 | Temp 98.4°F | Ht 70.75 in | Wt 214.0 lb

## 2020-07-24 DIAGNOSIS — Z0000 Encounter for general adult medical examination without abnormal findings: Secondary | ICD-10-CM

## 2020-07-24 DIAGNOSIS — Z125 Encounter for screening for malignant neoplasm of prostate: Secondary | ICD-10-CM

## 2020-07-24 LAB — CBC WITH DIFFERENTIAL/PLATELET
Basophils Absolute: 0.1 10*3/uL (ref 0.0–0.1)
Basophils Relative: 0.9 % (ref 0.0–3.0)
Eosinophils Absolute: 0.2 10*3/uL (ref 0.0–0.7)
Eosinophils Relative: 3.2 % (ref 0.0–5.0)
HCT: 48.7 % (ref 39.0–52.0)
Hemoglobin: 16.6 g/dL (ref 13.0–17.0)
Lymphocytes Relative: 42.8 % (ref 12.0–46.0)
Lymphs Abs: 2.8 10*3/uL (ref 0.7–4.0)
MCHC: 34.1 g/dL (ref 30.0–36.0)
MCV: 90.6 fl (ref 78.0–100.0)
Monocytes Absolute: 0.5 10*3/uL (ref 0.1–1.0)
Monocytes Relative: 8.2 % (ref 3.0–12.0)
Neutro Abs: 3 10*3/uL (ref 1.4–7.7)
Neutrophils Relative %: 44.9 % (ref 43.0–77.0)
Platelets: 260 10*3/uL (ref 150.0–400.0)
RBC: 5.38 Mil/uL (ref 4.22–5.81)
RDW: 13 % (ref 11.5–15.5)
WBC: 6.6 10*3/uL (ref 4.0–10.5)

## 2020-07-24 LAB — HEPATIC FUNCTION PANEL
ALT: 44 U/L (ref 0–53)
AST: 33 U/L (ref 0–37)
Albumin: 4.9 g/dL (ref 3.5–5.2)
Alkaline Phosphatase: 35 U/L — ABNORMAL LOW (ref 39–117)
Bilirubin, Direct: 0.2 mg/dL (ref 0.0–0.3)
Total Bilirubin: 0.7 mg/dL (ref 0.2–1.2)
Total Protein: 7.3 g/dL (ref 6.0–8.3)

## 2020-07-24 LAB — PSA: PSA: 0.24 ng/mL (ref 0.10–4.00)

## 2020-07-24 LAB — VITAMIN B12: Vitamin B-12: 552 pg/mL (ref 211–911)

## 2020-07-24 LAB — TSH: TSH: 2.38 u[IU]/mL (ref 0.35–4.50)

## 2020-07-24 LAB — BASIC METABOLIC PANEL
BUN: 19 mg/dL (ref 6–23)
CO2: 26 mEq/L (ref 19–32)
Calcium: 9.7 mg/dL (ref 8.4–10.5)
Chloride: 103 mEq/L (ref 96–112)
Creatinine, Ser: 1.14 mg/dL (ref 0.40–1.50)
GFR: 68.86 mL/min (ref >60.00)
Glucose, Bld: 118 mg/dL — ABNORMAL HIGH (ref 70–99)
Potassium: 4 mEq/L (ref 3.5–5.1)
Sodium: 138 mEq/L (ref 135–145)

## 2020-07-24 LAB — LIPID PANEL
Cholesterol: 141 mg/dL (ref 0–200)
HDL: 30.6 mg/dL — ABNORMAL LOW (ref >39.00)
NonHDL: 110.16
Total CHOL/HDL Ratio: 5
Triglycerides: 206 mg/dL — ABNORMAL HIGH (ref 0.0–149.0)
VLDL: 41.2 mg/dL — ABNORMAL HIGH (ref 0.0–40.0)

## 2020-07-24 LAB — HEMOGLOBIN A1C: Hgb A1c MFr Bld: 6.5 % (ref 4.6–6.5)

## 2020-07-24 LAB — LDL CHOLESTEROL, DIRECT: Direct LDL: 95 mg/dL

## 2020-07-24 LAB — T4, FREE: Free T4: 0.75 ng/dL (ref 0.60–1.60)

## 2020-07-24 LAB — T3, FREE: T3, Free: 3.6 pg/mL (ref 2.3–4.2)

## 2020-07-24 MED ORDER — CYANOCOBALAMIN 1000 MCG/ML IJ SOLN: 1000.0000 ug | INTRAMUSCULAR | 11 refills | Status: DC

## 2020-07-24 NOTE — Progress Notes (Signed)
Subjective:    Patient ID: Jonathan Howard, male    DOB: 09/17/1955, 65 y.o.   MRN: [id number]  HPI Here for a well exam. He has a few issues to talk about. First over the past year he has developed cramps in both calves that come and go. No swelling in the ankles or legs. Also his libido has dropped considerably. No trouble with erections.    Review of Systems  Constitutional: Negative.   HENT: Negative.   Eyes: Negative.   Respiratory: Negative.   Cardiovascular: Negative.   Gastrointestinal: Negative.   Genitourinary: Negative.   Musculoskeletal: Positive for myalgias.  Skin: Negative.   Neurological: Negative.   Psychiatric/Behavioral: Negative.        Objective:   Physical Exam Constitutional:      General: He is not in acute distress.    Appearance: Normal appearance. He is well-developed. He is not diaphoretic.  HENT:     Head: Normocephalic and atraumatic.     Right Ear: External ear normal.     Left Ear: External ear normal.     Nose: Nose normal.     Mouth/Throat:     Pharynx: No oropharyngeal exudate.  Eyes:     General: No scleral icterus.       Right eye: No discharge.        Left eye: No discharge.     Conjunctiva/sclera: Conjunctivae normal.     Pupils: Pupils are equal, round, and reactive to light.  Neck:     Thyroid: No thyromegaly.     Vascular: No JVD.     Trachea: No tracheal deviation.  Cardiovascular:     Rate and Rhythm: Normal rate and regular rhythm.     Heart sounds: Normal heart sounds. No murmur heard. No friction rub. No gallop.   Pulmonary:     Effort: Pulmonary effort is normal. No respiratory distress.     Breath sounds: Normal breath sounds. No wheezing or rales.  Chest:     Chest wall: No tenderness.  Abdominal:     General: Bowel sounds are normal. There is no distension.     Palpations: Abdomen is soft. There is no mass.     Tenderness: There is no abdominal tenderness. There is no guarding or rebound.  Genitourinary:     Penis: Normal. No tenderness.      Testes: Normal.     Prostate: Normal.     Rectum: Normal. Guaiac result negative.  Musculoskeletal:        General: No tenderness. Normal range of motion.     Cervical back: Neck supple.  Lymphadenopathy:     Cervical: No cervical adenopathy.  Skin:    General: Skin is warm and dry.     Coloration: Skin is not pale.     Findings: No erythema or rash.  Neurological:     Mental Status: He is alert and oriented to person, place, and time.     Cranial Nerves: No cranial nerve deficit.     Motor: No abnormal muscle tone.     Coordination: Coordination normal.     Deep Tendon Reflexes: Reflexes are normal and symmetric. Reflexes normal.  Psychiatric:        Behavior: Behavior normal.        Thought Content: Thought content normal.        Judgment: Judgment normal.           Assessment & Plan:  Well exam. We discussed diet and  exercise. Get fasting labs. For the low libido, we will check a testosterone level. For the leg cramps, he will take 1-2 tablets of 400 mg magnesium daily.  Alysia Penna, MD

## 2020-08-12 ENCOUNTER — Telehealth: Payer: Self-pay | Admitting: Family Medicine

## 2020-08-12 DIAGNOSIS — R6882 Decreased libido: Secondary | ICD-10-CM

## 2020-08-12 NOTE — Telephone Encounter (Signed)
Pt call and stated he want a call back about his testosterone level.He stated call him on [phone].

## 2020-08-12 NOTE — Telephone Encounter (Signed)
Spoke with patient to verify request, he has wants to speak to someone about testosterone results from 07/24/2020.   Results not listed.  Please advise

## 2020-08-12 NOTE — Telephone Encounter (Signed)
We did not get a testosterone level that day, so I have now put in the order to get one. He can make a lab appt (no fasting needed)

## 2020-08-13 NOTE — Telephone Encounter (Signed)
Spoke with patient and scheduled labs for 08/14/2020.

## 2020-08-14 ENCOUNTER — Other Ambulatory Visit (INDEPENDENT_AMBULATORY_CARE_PROVIDER_SITE_OTHER): Payer: 59

## 2020-08-14 ENCOUNTER — Other Ambulatory Visit: Payer: Self-pay

## 2020-08-14 DIAGNOSIS — R6882 Decreased libido: Secondary | ICD-10-CM | POA: Diagnosis not present

## 2020-08-14 LAB — TESTOSTERONE: Testosterone: 402.06 ng/dL (ref 300.00–890.00)

## 2020-10-24 ENCOUNTER — Other Ambulatory Visit: Payer: Self-pay | Admitting: Family Medicine

## 2020-10-26 ENCOUNTER — Other Ambulatory Visit: Payer: Self-pay | Admitting: Family Medicine

## 2021-04-20 ENCOUNTER — Telehealth: Payer: Self-pay

## 2021-04-20 NOTE — Telephone Encounter (Signed)
--  Caller states her husband has a bad cough and is wheezing. Tested negative for covid today.  04/17/2021 1:11:03 PM See HCP within 4 Hours (or PCP triage) Cherre Robins, RN, [facility]  04/20/21 1257: Pt states went to UC; received pearls & inhaler; tested negative for Covid. Pt denies SOB or fever. Offered appt with PCP. Pt states he will continue management as prescribed by UC provider, but is aware to schedule in person appt with any available provider if symptoms fail to improve or worsen. Denies ques/concerns at this time.

## 2021-05-30 ENCOUNTER — Other Ambulatory Visit: Payer: Self-pay | Admitting: Family Medicine

## 2021-06-25 ENCOUNTER — Encounter: Payer: Self-pay | Admitting: Family Medicine

## 2021-06-25 ENCOUNTER — Telehealth (INDEPENDENT_AMBULATORY_CARE_PROVIDER_SITE_OTHER): Payer: Managed Care, Other (non HMO) | Admitting: Family Medicine

## 2021-06-25 ENCOUNTER — Telehealth: Payer: Self-pay | Admitting: Family Medicine

## 2021-06-25 DIAGNOSIS — J40 Bronchitis, not specified as acute or chronic: Secondary | ICD-10-CM

## 2021-06-25 MED ORDER — AZITHROMYCIN 250 MG PO TABS: ORAL_TABLET | ORAL | 0 refills | Status: DC

## 2021-06-25 MED ORDER — BENZONATATE 200 MG PO CAPS: 200.0000 mg | ORAL_CAPSULE | Freq: Four times a day (QID) | ORAL | 0 refills | Status: DC | PRN

## 2021-06-25 NOTE — Progress Notes (Signed)
? ?  Subjective:  ? ? Patient ID: Jonathan Howard, male    DOB: 01/20/1956, 66 y.o.   MRN: [id number] ? ?HPI ?Virtual Visit via Telephone Note ? ?I connected with the patient on 06/25/21 at  1:15 PM EST by telephone and verified that I am speaking with the correct person using two identifiers. ?  ?I discussed the limitations, risks, security and privacy concerns of performing an evaluation and management service by telephone and the availability of in person appointments. I also discussed with the patient that there may be a patient responsible charge related to this service. The patient expressed understanding and agreed to proceed. ? ?Location patient: home ?Location provider: work or home office ?Participants present for the call: patient, provider ?Patient did not have a visit in the prior 7 days to address this/these issue(s). ? ? ?History of Present Illness: ?Here for 2 days of chest congestion, dry cough, and ST. No fever or SOB. Hehas tested negative for the Covid virus. His wife has had similar symptoms for the past week. Drinking fluids and using lozenges.  ?  ?Observations/Objective: ?Patient sounds cheerful and well on the phone. ?I do not appreciate any SOB. ?Speech and thought processing are grossly intact. ?Patient reported vitals: ? ?Assessment and Plan: ?Bronchitis, treat with a Zpack. Use Benzonatate as needed.  ?Alysia Penna, MD ? ? ?Follow Up Instructions: ? ? ? ? ?30865 5-10 ?99442 11-20 ?9443 21-30 ?I did not refer this patient for an OV in the next 24 hours for this/these issue(s). ? ?I discussed the assessment and treatment plan with the patient. The patient was provided an opportunity to ask questions and all were answered. The patient agreed with the plan and demonstrated an understanding of the instructions. ?  ?The patient was advised to call back or seek an in-person evaluation if the symptoms worsen or if the condition fails to improve as anticipated. ? ?I provided 12 minutes of  non-face-to-face time during this encounter. ? ? ?Alysia Penna, MD   ? ? ?Review of Systems ? ?   ?Objective:  ? Physical Exam ? ? ? ? ?   ?Assessment & Plan:  ? ? ?

## 2021-06-25 NOTE — Telephone Encounter (Signed)
Patient calling in with respiratory symptoms: ?Shortness of breath, chest pain, palpitations or other red words send to Triage ? ?Does the patient have a fever over 100, cough, congestion, sore throat, runny nose, lost of taste/smell (please list symptoms that patient has)?sore throat and cough ?(If over 5 days ago, pt may be scheduled for in person visit) ? ?Have you tested for Covid in the last 5 days? No  ? ?If yes, was it positive '[]'$  OR negative '[]'$ ? If positive in the last 5 days, please schedule virtual visit now. If negative, schedule for an in person OV with the next available provider if PCP has no openings. Please also let patient know they will be tested again (follow the script below) ? ?"you will have to arrive 66mns prior to your appt time to be Covid tested. Please park in back of office at the [facility] & call [phone] let the staff know you have arrived. A staff member will meet you at your car to do a rapid covid test. Once the test has resulted you will be notified by phone of your results to determine if appt will remain an in person visit or be converted to a virtual/phone visit. If you arrive less than 327ms before your appt time, your visit will be automatically converted to virtual & any recommended testing will happen AFTER the visit." ?Pt has a virtual at 06-25-2021 115 pm ? ?THINGS TO REMEMBER ? ?If no availability for virtual visit in office,  please schedule another [facility] office ? ?If no availability at another [facility], please instruct patient that they can schedule an evisit or virtual visit through their mychart account. Visits up to 8pm ? ?patients can be seen in office 5 days after positive COVID test ? ?  ?

## 2021-06-28 ENCOUNTER — Encounter: Payer: Self-pay | Admitting: Family Medicine

## 2021-06-28 ENCOUNTER — Ambulatory Visit (INDEPENDENT_AMBULATORY_CARE_PROVIDER_SITE_OTHER): Payer: Managed Care, Other (non HMO) | Admitting: Family Medicine

## 2021-06-28 VITALS — BP 140/96 | HR 66 | Temp 99.2°F | Wt 212.2 lb

## 2021-06-28 DIAGNOSIS — R059 Cough, unspecified: Secondary | ICD-10-CM | POA: Diagnosis not present

## 2021-06-28 DIAGNOSIS — J020 Streptococcal pharyngitis: Secondary | ICD-10-CM

## 2021-06-28 LAB — POCT INFLUENZA A/B
Influenza A, POC: NEGATIVE
Influenza B, POC: NEGATIVE

## 2021-06-28 LAB — POC COVID19 BINAXNOW: SARS Coronavirus 2 Ag: NEGATIVE

## 2021-06-28 LAB — POCT RAPID STREP A (OFFICE): Rapid Strep A Screen: POSITIVE — AB

## 2021-06-28 MED ORDER — HYDROCODONE BIT-HOMATROP MBR 5-1.5 MG/5ML PO SOLN: 5.0000 mL | ORAL | 0 refills | Status: DC | PRN

## 2021-06-28 MED ORDER — ATORVASTATIN CALCIUM 20 MG PO TABS: 20.0000 mg | ORAL_TABLET | Freq: Every day | ORAL | 3 refills | Status: DC

## 2021-06-28 MED ORDER — FENOFIBRATE 160 MG PO TABS: ORAL_TABLET | ORAL | 3 refills | Status: DC

## 2021-06-28 MED ORDER — AMOXICILLIN-POT CLAVULANATE 875-125 MG PO TABS: 1.0000 | ORAL_TABLET | Freq: Two times a day (BID) | ORAL | 0 refills | Status: DC

## 2021-06-28 NOTE — Progress Notes (Signed)
? ?  Subjective:  ? ? Patient ID: Jonathan Howard, male    DOB: Sep 02, 1955, 66 y.o.   MRN: [id number] ? ?HPI ?Here for 10 days of a dry cough and low grade fevers. He has a ST the first day but none since. He has some nausea, but no vomiting or abdominal pain. His wife has been sick for 2 weeks, and she was found to have a strep throat infection st an urgent care last week. Here he testes negative for Covid-19 and for flu A and B. He is positive for strep.  ? ? ?Review of Systems  ?Constitutional:  Positive for fever.  ?HENT:  Negative for congestion, postnasal drip, sinus pressure and sore throat.   ?Eyes: Negative.   ?Respiratory:  Positive for cough and wheezing. Negative for shortness of breath.   ?Cardiovascular: Negative.   ? ?   ?Objective:  ? Physical Exam ?Constitutional:   ?   Appearance: Normal appearance. He is not ill-appearing.  ?   Comments: Coughs occasionally   ?HENT:  ?   Right Ear: Tympanic membrane, ear canal and external ear normal.  ?   Left Ear: Tympanic membrane, ear canal and external ear normal.  ?   Nose: Nose normal.  ?   Mouth/Throat:  ?   Pharynx: Oropharynx is clear. No oropharyngeal exudate or posterior oropharyngeal erythema.  ?Eyes:  ?   Conjunctiva/sclera: Conjunctivae normal.  ?Pulmonary:  ?   Effort: Pulmonary effort is normal.  ?   Breath sounds: Normal breath sounds.  ?Lymphadenopathy:  ?   Cervical: No cervical adenopathy.  ?Neurological:  ?   Mental Status: He is alert.  ? ? ? ? ? ?   ?Assessment & Plan:  ?Strep infection. Treat with Augmentin for 10 days . Try Hycodan for the cough.  ?Alysia Penna, MD ? ? ?

## 2021-07-27 ENCOUNTER — Ambulatory Visit (INDEPENDENT_AMBULATORY_CARE_PROVIDER_SITE_OTHER): Payer: Managed Care, Other (non HMO) | Admitting: Family Medicine

## 2021-07-27 ENCOUNTER — Encounter: Payer: Self-pay | Admitting: Family Medicine

## 2021-07-27 VITALS — BP 126/84 | HR 54 | Temp 97.9°F | Ht 70.75 in | Wt 210.0 lb

## 2021-07-27 DIAGNOSIS — M109 Gout, unspecified: Secondary | ICD-10-CM

## 2021-07-27 DIAGNOSIS — E538 Deficiency of other specified B group vitamins: Secondary | ICD-10-CM | POA: Insufficient documentation

## 2021-07-27 DIAGNOSIS — Z0000 Encounter for general adult medical examination without abnormal findings: Secondary | ICD-10-CM | POA: Diagnosis not present

## 2021-07-27 LAB — CBC WITH DIFFERENTIAL/PLATELET
Basophils Absolute: 0 10*3/uL (ref 0.0–0.1)
Basophils Relative: 0.4 % (ref 0.0–3.0)
Eosinophils Absolute: 0.4 10*3/uL (ref 0.0–0.7)
Eosinophils Relative: 5.3 % — ABNORMAL HIGH (ref 0.0–5.0)
HCT: 51 % (ref 39.0–52.0)
Hemoglobin: 17.2 g/dL — ABNORMAL HIGH (ref 13.0–17.0)
Lymphocytes Relative: 49 % — ABNORMAL HIGH (ref 12.0–46.0)
Lymphs Abs: 3.4 10*3/uL (ref 0.7–4.0)
MCHC: 33.7 g/dL (ref 30.0–36.0)
MCV: 91.7 fl (ref 78.0–100.0)
Monocytes Absolute: 0.6 10*3/uL (ref 0.1–1.0)
Monocytes Relative: 8.1 % (ref 3.0–12.0)
Neutro Abs: 2.6 10*3/uL (ref 1.4–7.7)
Neutrophils Relative %: 37.2 % — ABNORMAL LOW (ref 43.0–77.0)
Platelets: 271 10*3/uL (ref 150.0–400.0)
RBC: 5.56 Mil/uL (ref 4.22–5.81)
RDW: 13.4 % (ref 11.5–15.5)
WBC: 7 10*3/uL (ref 4.0–10.5)

## 2021-07-27 LAB — HEMOGLOBIN A1C: Hgb A1c MFr Bld: 6.7 % — ABNORMAL HIGH (ref 4.6–6.5)

## 2021-07-27 LAB — TSH: TSH: 4.6 u[IU]/mL (ref 0.35–5.50)

## 2021-07-27 LAB — HEPATIC FUNCTION PANEL
ALT: 47 U/L (ref 0–53)
AST: 42 U/L — ABNORMAL HIGH (ref 0–37)
Albumin: 5 g/dL (ref 3.5–5.2)
Alkaline Phosphatase: 38 U/L — ABNORMAL LOW (ref 39–117)
Bilirubin, Direct: 0.1 mg/dL (ref 0.0–0.3)
Total Bilirubin: 0.7 mg/dL (ref 0.2–1.2)
Total Protein: 7.5 g/dL (ref 6.0–8.3)

## 2021-07-27 LAB — LIPID PANEL
Cholesterol: 178 mg/dL (ref 0–200)
HDL: 35.2 mg/dL — ABNORMAL LOW (ref >39.00)
NonHDL: 142.81
Total CHOL/HDL Ratio: 5
Triglycerides: 318 mg/dL — ABNORMAL HIGH (ref 0.0–149.0)
VLDL: 63.6 mg/dL — ABNORMAL HIGH (ref 0.0–40.0)

## 2021-07-27 LAB — BASIC METABOLIC PANEL
BUN: 15 mg/dL (ref 6–23)
CO2: 26 mEq/L (ref 19–32)
Calcium: 9.9 mg/dL (ref 8.4–10.5)
Chloride: 105 mEq/L (ref 96–112)
Creatinine, Ser: 1.16 mg/dL (ref 0.40–1.50)
GFR: 66.96 mL/min (ref >60.00)
Glucose, Bld: 129 mg/dL — ABNORMAL HIGH (ref 70–99)
Potassium: 4.1 mEq/L (ref 3.5–5.1)
Sodium: 141 mEq/L (ref 135–145)

## 2021-07-27 LAB — URIC ACID: Uric Acid, Serum: 5.1 mg/dL (ref 4.0–7.8)

## 2021-07-27 LAB — LDL CHOLESTEROL, DIRECT: Direct LDL: 115 mg/dL

## 2021-07-27 LAB — PSA: PSA: 0.18 ng/mL (ref 0.10–4.00)

## 2021-07-27 LAB — VITAMIN B12: Vitamin B-12: 449 pg/mL (ref 211–911)

## 2021-07-27 MED ORDER — CYANOCOBALAMIN 1000 MCG/ML IJ SOLN: 1000.0000 ug | INTRAMUSCULAR | 11 refills | Status: DC

## 2021-07-27 NOTE — Progress Notes (Signed)
? ?  Subjective:  ? ? Patient ID: Jonathan Howard, male    DOB: [birth year], 65 y.o.   MRN: [id number] ? ?HPI ?Here for a well exam. He feels fine.  ? ? ?Review of Systems  ?Constitutional: Negative.   ?HENT: Negative.    ?Eyes: Negative.   ?Respiratory: Negative.    ?Cardiovascular: Negative.   ?Gastrointestinal: Negative.   ?Genitourinary: Negative.   ?Musculoskeletal: Negative.   ?Skin: Negative.   ?Neurological: Negative.   ?Psychiatric/Behavioral: Negative.    ? ?   ?Objective:  ? Physical Exam ?Constitutional:   ?   General: He is not in acute distress. ?   Appearance: Normal appearance. He is well-developed. He is not diaphoretic.  ?HENT:  ?   Head: Normocephalic and atraumatic.  ?   Right Ear: External ear normal.  ?   Left Ear: External ear normal.  ?   Nose: Nose normal.  ?   Mouth/Throat:  ?   Pharynx: No oropharyngeal exudate.  ?Eyes:  ?   General: No scleral icterus.    ?   Right eye: No discharge.     ?   Left eye: No discharge.  ?   Conjunctiva/sclera: Conjunctivae normal.  ?   Pupils: Pupils are equal, round, and reactive to light.  ?Neck:  ?   Thyroid: No thyromegaly.  ?   Vascular: No JVD.  ?   Trachea: No tracheal deviation.  ?Cardiovascular:  ?   Rate and Rhythm: Normal rate and regular rhythm.  ?   Heart sounds: Normal heart sounds. No murmur heard. ?  No friction rub. No gallop.  ?Pulmonary:  ?   Effort: Pulmonary effort is normal. No respiratory distress.  ?   Breath sounds: Normal breath sounds. No wheezing or rales.  ?Chest:  ?   Chest wall: No tenderness.  ?Abdominal:  ?   General: Bowel sounds are normal. There is no distension.  ?   Palpations: Abdomen is soft. There is no mass.  ?   Tenderness: There is no abdominal tenderness. There is no guarding or rebound.  ?Genitourinary: ?   Penis: Normal. No tenderness.   ?   Testes: Normal.  ?Musculoskeletal:     ?   General: No tenderness. Normal range of motion.  ?   Cervical back: Neck supple.  ?Lymphadenopathy:  ?   Cervical: No cervical  adenopathy.  ?Skin: ?   General: Skin is warm and dry.  ?   Coloration: Skin is not pale.  ?   Findings: No erythema or rash.  ?Neurological:  ?   Mental Status: He is alert and oriented to person, place, and time.  ?   Cranial Nerves: No cranial nerve deficit.  ?   Motor: No abnormal muscle tone.  ?   Coordination: Coordination normal.  ?   Deep Tendon Reflexes: Reflexes are normal and symmetric. Reflexes normal.  ?Psychiatric:     ?   Behavior: Behavior normal.     ?   Thought Content: Thought content normal.     ?   Judgment: Judgment normal.  ? ? ? ? ? ?   ?Assessment & Plan:  ?Well exam. We discussed diet and exercise. Get fasting labs.  ?Alysia Penna, MD ? ? ?

## 2021-08-22 ENCOUNTER — Other Ambulatory Visit: Payer: Self-pay | Admitting: Family Medicine

## 2021-08-30 ENCOUNTER — Telehealth: Payer: Self-pay | Admitting: Family Medicine

## 2021-08-30 NOTE — Telephone Encounter (Signed)
Pt asks if the prescription can be fill for more than 1 month at a time cyanocobalamin (,VITAMIN B-12,) 1000 MCG/ML injection ?

## 2021-08-30 NOTE — Telephone Encounter (Signed)
Please advise 

## 2021-08-31 ENCOUNTER — Other Ambulatory Visit: Payer: Self-pay

## 2021-08-31 MED ORDER — CYANOCOBALAMIN 1000 MCG/ML IJ SOLN: 1000.0000 ug | INTRAMUSCULAR | 3 refills | Status: AC

## 2021-08-31 NOTE — Telephone Encounter (Signed)
Refill sent.

## 2021-08-31 NOTE — Telephone Encounter (Signed)
Please call in a one month supply with 11 rf  ?

## 2021-08-31 NOTE — Telephone Encounter (Signed)
Patient called requesting 90 day supply for Cyanocobalamin 1000 as oppose to 30 day supply.     Please advise if possible.  ?

## 2021-08-31 NOTE — Telephone Encounter (Signed)
Okay. Call in 90 day supply with 3 rf ?

## 2021-09-01 ENCOUNTER — Other Ambulatory Visit: Payer: Self-pay | Admitting: Family Medicine

## 2022-01-13 ENCOUNTER — Ambulatory Visit (INDEPENDENT_AMBULATORY_CARE_PROVIDER_SITE_OTHER): Payer: Commercial Managed Care - HMO | Admitting: Family Medicine

## 2022-01-13 ENCOUNTER — Encounter: Payer: Self-pay | Admitting: Family Medicine

## 2022-01-13 VITALS — BP 130/82 | HR 57 | Temp 98.0°F | Wt 212.0 lb

## 2022-01-13 DIAGNOSIS — L989 Disorder of the skin and subcutaneous tissue, unspecified: Secondary | ICD-10-CM | POA: Diagnosis not present

## 2022-01-13 DIAGNOSIS — M722 Plantar fascial fibromatosis: Secondary | ICD-10-CM

## 2022-01-13 DIAGNOSIS — R200 Anesthesia of skin: Secondary | ICD-10-CM

## 2022-01-13 NOTE — Progress Notes (Signed)
   Subjective:    Patient ID: Jonathan Howard, male    DOB: [birth year], 66 y.o.   MRN: [id number]  HPI Here for 3 issues. First he has had a irritated area of skin on his left temple for about 2 years. It has slowly grown larger. Second he has had an area of numbness on his right thumb for the past 4 weeks. There is no pain. 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 He bowls twice a week. Third he has had a sharp pain on the bottom of his right heel for 4 weeks. He wears athletic shoes most of the time with good arch supports.    Review of Systems  Constitutional: Negative.   Respiratory: Negative.    Cardiovascular: Negative.   Musculoskeletal:  Positive for arthralgias.  Neurological:  Positive for numbness.       Objective:   Physical Exam Constitutional:      Appearance: Normal appearance.  Cardiovascular:     Rate and Rhythm: Normal rate and regular rhythm.     Pulses: Normal pulses.     Heart sounds: Normal heart sounds.  Pulmonary:     Effort: Pulmonary effort is normal.     Breath sounds: Normal breath sounds.  Musculoskeletal:     Comments: The bottom of the right heel is tender, the arch is normal  Skin:    Comments: There is a 1 cm macular pink area of skin on the left temple that has a rough texture  Neurological:     Mental Status: He is alert.     Comments: There is an area of decreased sensation to light touch on the flexor surface of the right thumb. Otherwise the hand is intact. 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 He has plantar fasciitis on the right foot, so we will refer him to Podiatry. Finally he has some numbness on the right thumb which is the result of pressure on the nerve from gripping his bowling bowl. I do not think there is much we can do about this since he is committed to bowling. At least the chance of  causing any permament nerve damage is very small. Recheck as needed.  Alysia Penna, MD

## 2022-01-26 ENCOUNTER — Encounter: Payer: Self-pay | Admitting: Podiatry

## 2022-01-26 ENCOUNTER — Ambulatory Visit (INDEPENDENT_AMBULATORY_CARE_PROVIDER_SITE_OTHER): Payer: Commercial Managed Care - HMO | Admitting: Podiatry

## 2022-01-26 ENCOUNTER — Ambulatory Visit (INDEPENDENT_AMBULATORY_CARE_PROVIDER_SITE_OTHER): Payer: Commercial Managed Care - HMO

## 2022-01-26 DIAGNOSIS — M722 Plantar fascial fibromatosis: Secondary | ICD-10-CM | POA: Diagnosis not present

## 2022-01-26 DIAGNOSIS — M205X1 Other deformities of toe(s) (acquired), right foot: Secondary | ICD-10-CM | POA: Diagnosis not present

## 2022-01-26 MED ORDER — TRIAMCINOLONE ACETONIDE 10 MG/ML IJ SUSP
10.0000 mg | Freq: Once | INTRAMUSCULAR | Status: AC
  Administered 2022-01-26: 10 mg

## 2022-01-26 MED ORDER — DICLOFENAC SODIUM 75 MG PO TBEC: 75.0000 mg | DELAYED_RELEASE_TABLET | Freq: Two times a day (BID) | ORAL | 2 refills | Status: DC

## 2022-01-26 NOTE — Progress Notes (Signed)
Subjective:   Patient ID: Jonathan Howard, male   DOB: 66 y.o.   MRN: [id number]   HPI Patient states he is developed a lot of pain in his right heel of 2 months duration and also does have some thickness around the big toe joint right with reduced range of motion.  Patient states it is worse during the day and still can hurt after sitting or when getting up in the morning patient does not smoke likes to be active   Review of Systems  All other systems reviewed and are negative.       Objective:  Physical Exam Vitals and nursing note reviewed.  Constitutional:      Appearance: He is well-developed.  Pulmonary:     Effort: Pulmonary effort is normal.  Musculoskeletal:        General: Normal range of motion.  Skin:    General: Skin is warm.  Neurological:     Mental Status: He is alert.     Neurovascular status intact muscle strength adequate range of motion within normal limits with exquisite discomfort plantar fascia right at the insertion of the tendon calcaneus inflammation fluid around the medial band with patient found to have good digital perfusion well-oriented x3     Assessment:  Acute tar fasciitis right inflammation fluid with moderate hallux limitus deformity right with spur formation     Plan:  H&P conditions reviewed went ahead today did sterile prep injected the plantar fascia right 3 mg Kenalog 5 mg Xylocaine and applied fascial brace that was fitted properly to lift up the arch and take pressure off the heel and also discussed hallux limitus do not recommend current treatment  X-rays indicate small plantar spur formation right heel no indication of stress fracture moderate spurring around the big toe joint right

## 2022-01-26 NOTE — Patient Instructions (Signed)

## 2022-02-09 ENCOUNTER — Ambulatory Visit: Payer: Commercial Managed Care - HMO | Admitting: Podiatry

## 2022-02-28 ENCOUNTER — Ambulatory Visit: Payer: Commercial Managed Care - HMO | Admitting: Podiatry

## 2022-05-05 ENCOUNTER — Other Ambulatory Visit: Payer: Self-pay | Admitting: Podiatry

## 2022-07-16 ENCOUNTER — Other Ambulatory Visit: Payer: Self-pay | Admitting: Family Medicine

## 2022-07-17 ENCOUNTER — Other Ambulatory Visit: Payer: Self-pay | Admitting: Family Medicine

## 2022-07-17 DIAGNOSIS — E785 Hyperlipidemia, unspecified: Secondary | ICD-10-CM

## 2022-08-05 ENCOUNTER — Other Ambulatory Visit: Payer: Self-pay | Admitting: Podiatry

## 2023-05-25 DIAGNOSIS — I10 Essential (primary) hypertension: Secondary | ICD-10-CM | POA: Diagnosis not present

## 2023-05-25 DIAGNOSIS — E119 Type 2 diabetes mellitus without complications: Secondary | ICD-10-CM | POA: Diagnosis not present

## 2023-05-25 DIAGNOSIS — E1169 Type 2 diabetes mellitus with other specified complication: Secondary | ICD-10-CM | POA: Diagnosis not present

## 2023-05-25 DIAGNOSIS — D510 Vitamin B12 deficiency anemia due to intrinsic factor deficiency: Secondary | ICD-10-CM | POA: Diagnosis not present

## 2023-05-25 DIAGNOSIS — E7800 Pure hypercholesterolemia, unspecified: Secondary | ICD-10-CM | POA: Diagnosis not present

## 2023-09-15 DIAGNOSIS — A6920 Lyme disease, unspecified: Secondary | ICD-10-CM | POA: Diagnosis not present

## 2023-11-16 DIAGNOSIS — E119 Type 2 diabetes mellitus without complications: Secondary | ICD-10-CM | POA: Diagnosis not present

## 2023-11-16 DIAGNOSIS — E7800 Pure hypercholesterolemia, unspecified: Secondary | ICD-10-CM | POA: Diagnosis not present

## 2023-11-16 DIAGNOSIS — D510 Vitamin B12 deficiency anemia due to intrinsic factor deficiency: Secondary | ICD-10-CM | POA: Diagnosis not present

## 2023-11-16 DIAGNOSIS — I10 Essential (primary) hypertension: Secondary | ICD-10-CM | POA: Diagnosis not present

## 2023-11-16 DIAGNOSIS — Z0000 Encounter for general adult medical examination without abnormal findings: Secondary | ICD-10-CM | POA: Diagnosis not present

## 2023-11-16 DIAGNOSIS — E1169 Type 2 diabetes mellitus with other specified complication: Secondary | ICD-10-CM | POA: Diagnosis not present

## 2023-12-29 DIAGNOSIS — H53143 Visual discomfort, bilateral: Secondary | ICD-10-CM | POA: Diagnosis not present

## 2023-12-29 DIAGNOSIS — E119 Type 2 diabetes mellitus without complications: Secondary | ICD-10-CM | POA: Diagnosis not present

## 2023-12-29 DIAGNOSIS — H2513 Age-related nuclear cataract, bilateral: Secondary | ICD-10-CM | POA: Diagnosis not present

## 2024-01-05 DIAGNOSIS — S39012A Strain of muscle, fascia and tendon of lower back, initial encounter: Secondary | ICD-10-CM | POA: Diagnosis not present

## 2024-02-12 DIAGNOSIS — M545 Low back pain, unspecified: Secondary | ICD-10-CM | POA: Diagnosis not present

## 2024-02-12 DIAGNOSIS — M62830 Muscle spasm of back: Secondary | ICD-10-CM | POA: Diagnosis not present
